# Patient Record
Sex: Male | Born: 1940 | Race: White | Hispanic: No | Marital: Married | Smoking: Never smoker
Health system: Southern US, Community
[De-identification: ages and names within clinical notes are randomized; demographics above are authoritative.]

## PROBLEM LIST (undated history)

## (undated) DIAGNOSIS — E039 Hypothyroidism, unspecified: Secondary | ICD-10-CM

## (undated) DIAGNOSIS — G4733 Obstructive sleep apnea (adult) (pediatric): Secondary | ICD-10-CM

## (undated) DIAGNOSIS — N529 Male erectile dysfunction, unspecified: Secondary | ICD-10-CM

## (undated) DIAGNOSIS — IMO0002 Reserved for concepts with insufficient information to code with codable children: Secondary | ICD-10-CM

## (undated) DIAGNOSIS — R5381 Other malaise: Secondary | ICD-10-CM

## (undated) DIAGNOSIS — Z9861 Coronary angioplasty status: Secondary | ICD-10-CM

## (undated) DIAGNOSIS — D696 Thrombocytopenia, unspecified: Secondary | ICD-10-CM

## (undated) DIAGNOSIS — M25539 Pain in unspecified wrist: Secondary | ICD-10-CM

## (undated) DIAGNOSIS — R41840 Attention and concentration deficit: Secondary | ICD-10-CM

## (undated) DIAGNOSIS — H8110 Benign paroxysmal vertigo, unspecified ear: Secondary | ICD-10-CM

## (undated) DIAGNOSIS — K117 Disturbances of salivary secretion: Secondary | ICD-10-CM

## (undated) DIAGNOSIS — M1710 Unilateral primary osteoarthritis, unspecified knee: Secondary | ICD-10-CM

## (undated) DIAGNOSIS — H269 Unspecified cataract: Secondary | ICD-10-CM

## (undated) DIAGNOSIS — M79609 Pain in unspecified limb: Secondary | ICD-10-CM

## (undated) DIAGNOSIS — I480 Paroxysmal atrial fibrillation: Secondary | ICD-10-CM

## (undated) DIAGNOSIS — F329 Major depressive disorder, single episode, unspecified: Secondary | ICD-10-CM

## (undated) DIAGNOSIS — F3289 Other specified depressive episodes: Secondary | ICD-10-CM

## (undated) DIAGNOSIS — G4700 Insomnia, unspecified: Secondary | ICD-10-CM

## (undated) DIAGNOSIS — R310 Gross hematuria: Secondary | ICD-10-CM

## (undated) DIAGNOSIS — I2510 Atherosclerotic heart disease of native coronary artery without angina pectoris: Secondary | ICD-10-CM

## (undated) DIAGNOSIS — E785 Hyperlipidemia, unspecified: Secondary | ICD-10-CM

## (undated) DIAGNOSIS — R5383 Other fatigue: Secondary | ICD-10-CM

## (undated) DIAGNOSIS — H9190 Unspecified hearing loss, unspecified ear: Secondary | ICD-10-CM

## (undated) DIAGNOSIS — G729 Myopathy, unspecified: Secondary | ICD-10-CM

## (undated) DIAGNOSIS — I10 Essential (primary) hypertension: Secondary | ICD-10-CM

## (undated) HISTORY — DX: Disturbances of salivary secretion: K11.7

## (undated) HISTORY — DX: Other malaise: R53.81

## (undated) HISTORY — DX: Male erectile dysfunction, unspecified: N52.9

## (undated) HISTORY — DX: Essential (primary) hypertension: I10

## (undated) HISTORY — DX: Unspecified cataract: H26.9

## (undated) HISTORY — DX: Hypothyroidism, unspecified: E03.9

## (undated) HISTORY — DX: Other malaise: R53.83

## (undated) HISTORY — DX: Attention and concentration deficit: R41.840

## (undated) HISTORY — DX: Atherosclerotic heart disease of native coronary artery without angina pectoris: I25.10

## (undated) HISTORY — DX: Coronary angioplasty status: Z98.61

## (undated) HISTORY — DX: Pain in unspecified wrist: M25.539

## (undated) HISTORY — PX: EXPLORATORY LAPAROTOMY: SUR591

## (undated) HISTORY — DX: Unilateral primary osteoarthritis, unspecified knee: M17.10

## (undated) HISTORY — DX: Insomnia, unspecified: G47.00

## (undated) HISTORY — DX: Reserved for concepts with insufficient information to code with codable children: IMO0002

## (undated) HISTORY — DX: Obstructive sleep apnea (adult) (pediatric): G47.33

## (undated) HISTORY — DX: Myopathy, unspecified: G72.9

## (undated) HISTORY — DX: Hyperlipidemia, unspecified: E78.5

## (undated) HISTORY — DX: Paroxysmal atrial fibrillation: I48.0

## (undated) HISTORY — DX: Unspecified hearing loss, unspecified ear: H91.90

## (undated) HISTORY — DX: Pain in unspecified limb: M79.609

## (undated) HISTORY — DX: Other specified depressive episodes: F32.89

## (undated) HISTORY — DX: Major depressive disorder, single episode, unspecified: F32.9

## (undated) HISTORY — PX: APPENDECTOMY: SHX54

## (undated) HISTORY — DX: Benign paroxysmal vertigo, unspecified ear: H81.10

## (undated) HISTORY — DX: Gross hematuria: R31.0

## (undated) HISTORY — DX: Thrombocytopenia, unspecified: D69.6

---
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
## 1966-11-29 HISTORY — PX: OTHER SURGICAL HISTORY: SHX169

## 1972-11-29 HISTORY — PX: STOMACH SURGERY: SHX791

## 1985-11-29 HISTORY — PX: CHOLECYSTECTOMY: SHX55

## 1987-11-30 HISTORY — PX: SEPTOPLASTY: SUR1290

## 1993-11-29 HISTORY — PX: NASAL SINUS SURGERY: SHX719

## 2003-05-07 ENCOUNTER — Ambulatory Visit (HOSPITAL_COMMUNITY): Admission: RE | Admit: 2003-05-07 | Discharge: 2003-05-07 | Payer: Self-pay | Admitting: *Deleted

## 2003-05-07 ENCOUNTER — Encounter (INDEPENDENT_AMBULATORY_CARE_PROVIDER_SITE_OTHER): Payer: Self-pay | Admitting: *Deleted

## 2003-05-16 ENCOUNTER — Encounter: Payer: Self-pay | Admitting: Otolaryngology

## 2003-05-16 ENCOUNTER — Encounter: Admission: RE | Admit: 2003-05-16 | Discharge: 2003-05-16 | Payer: Self-pay | Admitting: Otolaryngology

## 2003-08-01 ENCOUNTER — Encounter: Admission: RE | Admit: 2003-08-01 | Discharge: 2003-10-30 | Payer: Self-pay | Admitting: Internal Medicine

## 2003-11-30 HISTORY — PX: ANKLE SURGERY: SHX546

## 2004-09-04 ENCOUNTER — Ambulatory Visit (HOSPITAL_COMMUNITY): Admission: RE | Admit: 2004-09-04 | Discharge: 2004-09-05 | Payer: Self-pay | Admitting: Orthopedic Surgery

## 2005-05-13 ENCOUNTER — Inpatient Hospital Stay (HOSPITAL_COMMUNITY): Admission: EM | Admit: 2005-05-13 | Discharge: 2005-05-21 | Payer: Self-pay | Admitting: *Deleted

## 2005-06-10 ENCOUNTER — Emergency Department (HOSPITAL_COMMUNITY): Admission: EM | Admit: 2005-06-10 | Discharge: 2005-06-11 | Payer: Self-pay | Admitting: Emergency Medicine

## 2005-07-08 ENCOUNTER — Encounter (HOSPITAL_COMMUNITY): Admission: RE | Admit: 2005-07-08 | Discharge: 2005-10-06 | Payer: Self-pay | Admitting: Cardiology

## 2005-10-07 ENCOUNTER — Encounter (HOSPITAL_COMMUNITY): Admission: RE | Admit: 2005-10-07 | Discharge: 2006-01-05 | Payer: Self-pay | Admitting: Cardiology

## 2006-10-26 ENCOUNTER — Inpatient Hospital Stay (HOSPITAL_COMMUNITY): Admission: AD | Admit: 2006-10-26 | Discharge: 2006-10-31 | Payer: Self-pay | Admitting: Cardiology

## 2006-10-27 ENCOUNTER — Encounter (INDEPENDENT_AMBULATORY_CARE_PROVIDER_SITE_OTHER): Payer: Self-pay | Admitting: Cardiology

## 2006-12-05 ENCOUNTER — Ambulatory Visit (HOSPITAL_COMMUNITY): Admission: RE | Admit: 2006-12-05 | Discharge: 2006-12-05 | Payer: Self-pay | Admitting: *Deleted

## 2007-01-04 ENCOUNTER — Ambulatory Visit (HOSPITAL_COMMUNITY): Admission: RE | Admit: 2007-01-04 | Discharge: 2007-01-04 | Payer: Self-pay | Admitting: Cardiology

## 2007-06-23 ENCOUNTER — Encounter: Admission: RE | Admit: 2007-06-23 | Discharge: 2007-06-23 | Payer: Self-pay | Admitting: Internal Medicine

## 2007-06-27 ENCOUNTER — Encounter: Admission: RE | Admit: 2007-06-27 | Discharge: 2007-06-27 | Payer: Self-pay | Admitting: Internal Medicine

## 2007-11-24 ENCOUNTER — Emergency Department (HOSPITAL_COMMUNITY): Admission: EM | Admit: 2007-11-24 | Discharge: 2007-11-24 | Payer: Self-pay | Admitting: Family Medicine

## 2008-04-05 ENCOUNTER — Ambulatory Visit (HOSPITAL_COMMUNITY): Admission: RE | Admit: 2008-04-05 | Discharge: 2008-04-05 | Payer: Self-pay | Admitting: *Deleted

## 2008-04-05 LAB — HM COLONOSCOPY

## 2009-11-29 HISTORY — PX: EYE SURGERY: SHX253

## 2010-02-27 DIAGNOSIS — H269 Unspecified cataract: Secondary | ICD-10-CM

## 2010-02-27 HISTORY — DX: Unspecified cataract: H26.9

## 2010-04-24 ENCOUNTER — Inpatient Hospital Stay (HOSPITAL_COMMUNITY): Admission: RE | Admit: 2010-04-24 | Discharge: 2010-04-26 | Payer: Self-pay | Admitting: Orthopedic Surgery

## 2010-04-24 HISTORY — PX: WRIST SURGERY: SHX841

## 2010-11-17 IMAGING — CR DG CHEST 2V
2 series · 2 of 2 positions shown · non-contrast
Comparison: 01/04/2007

CLINICAL DATA: Preop.

CHEST - 2 VIEW

[w chest pa]
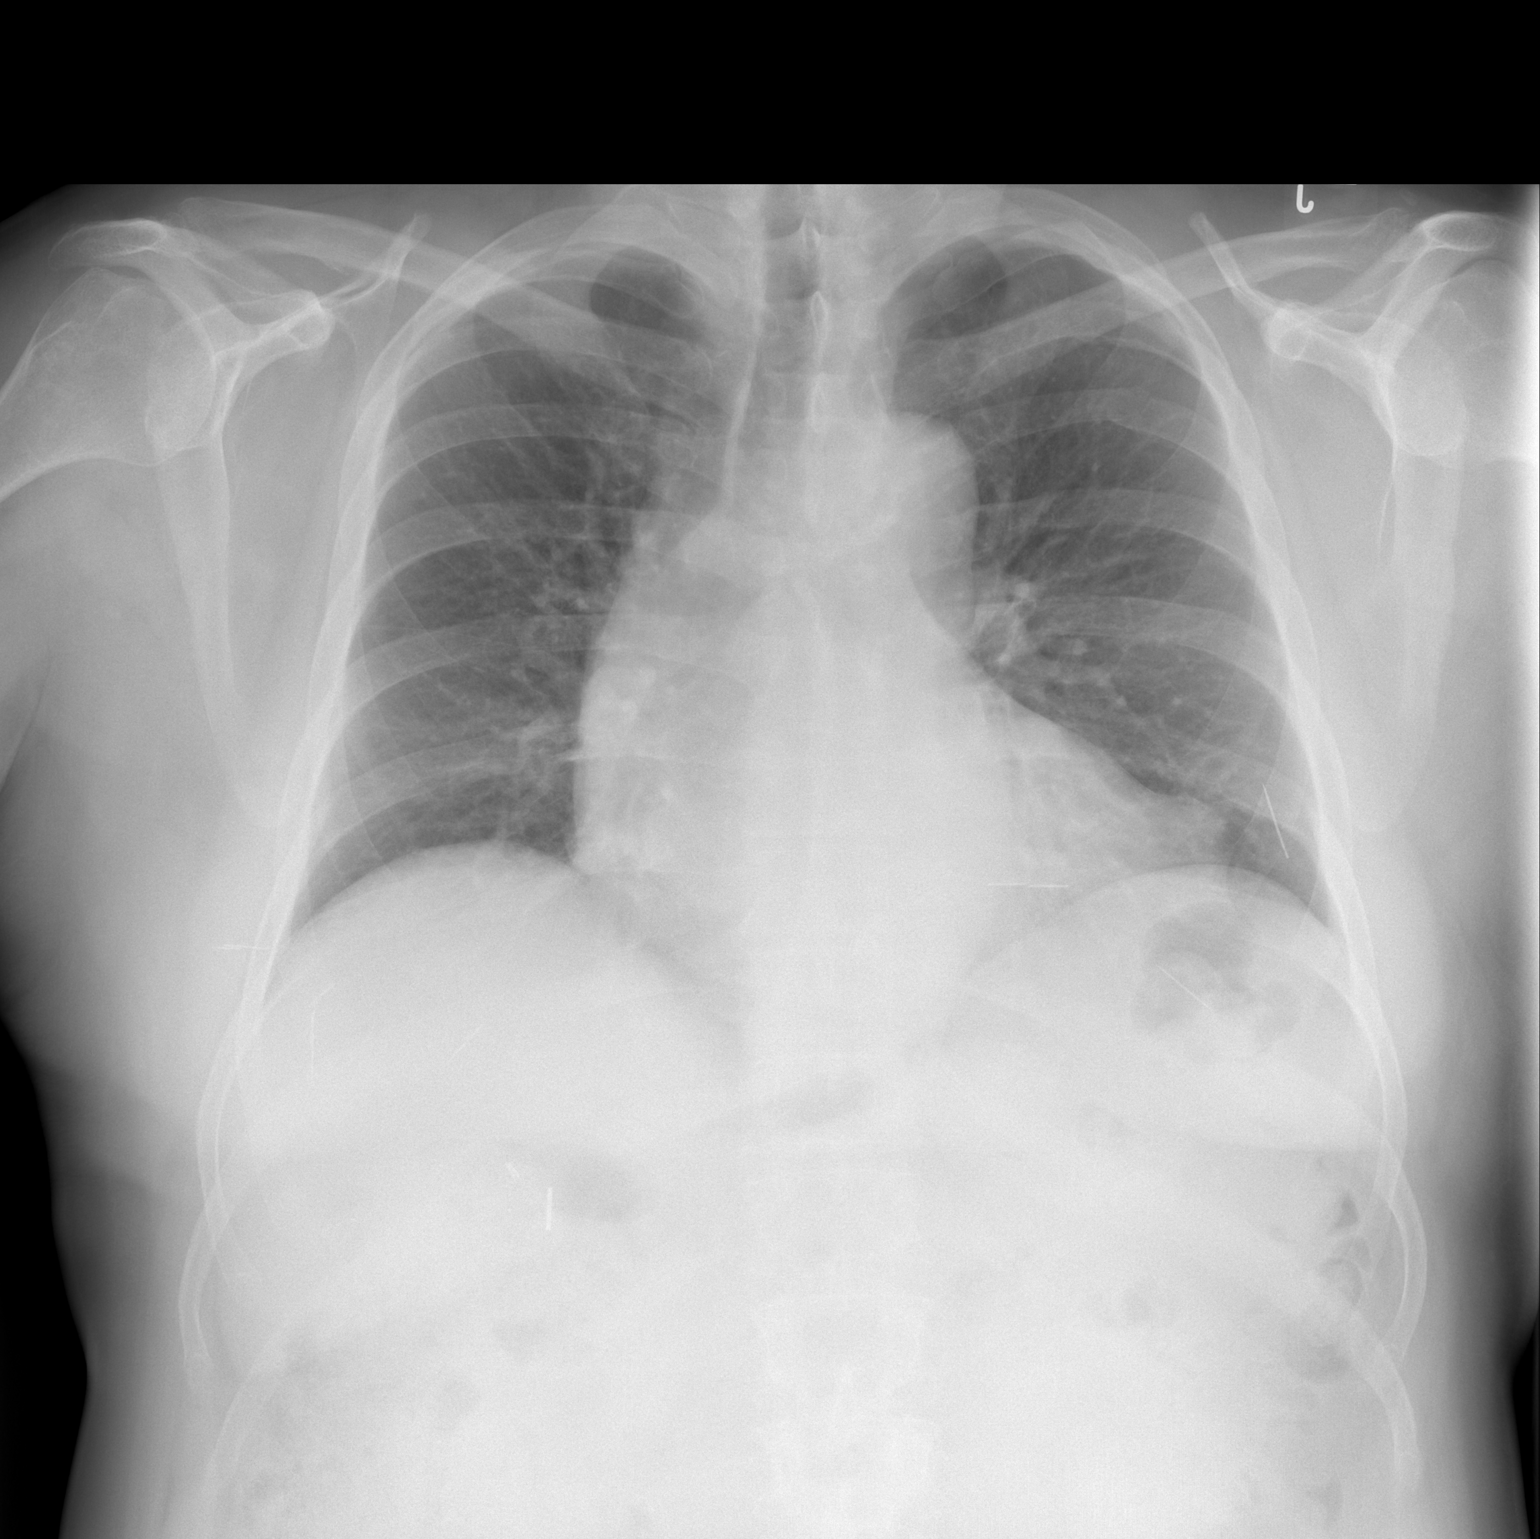

[w chest lat]
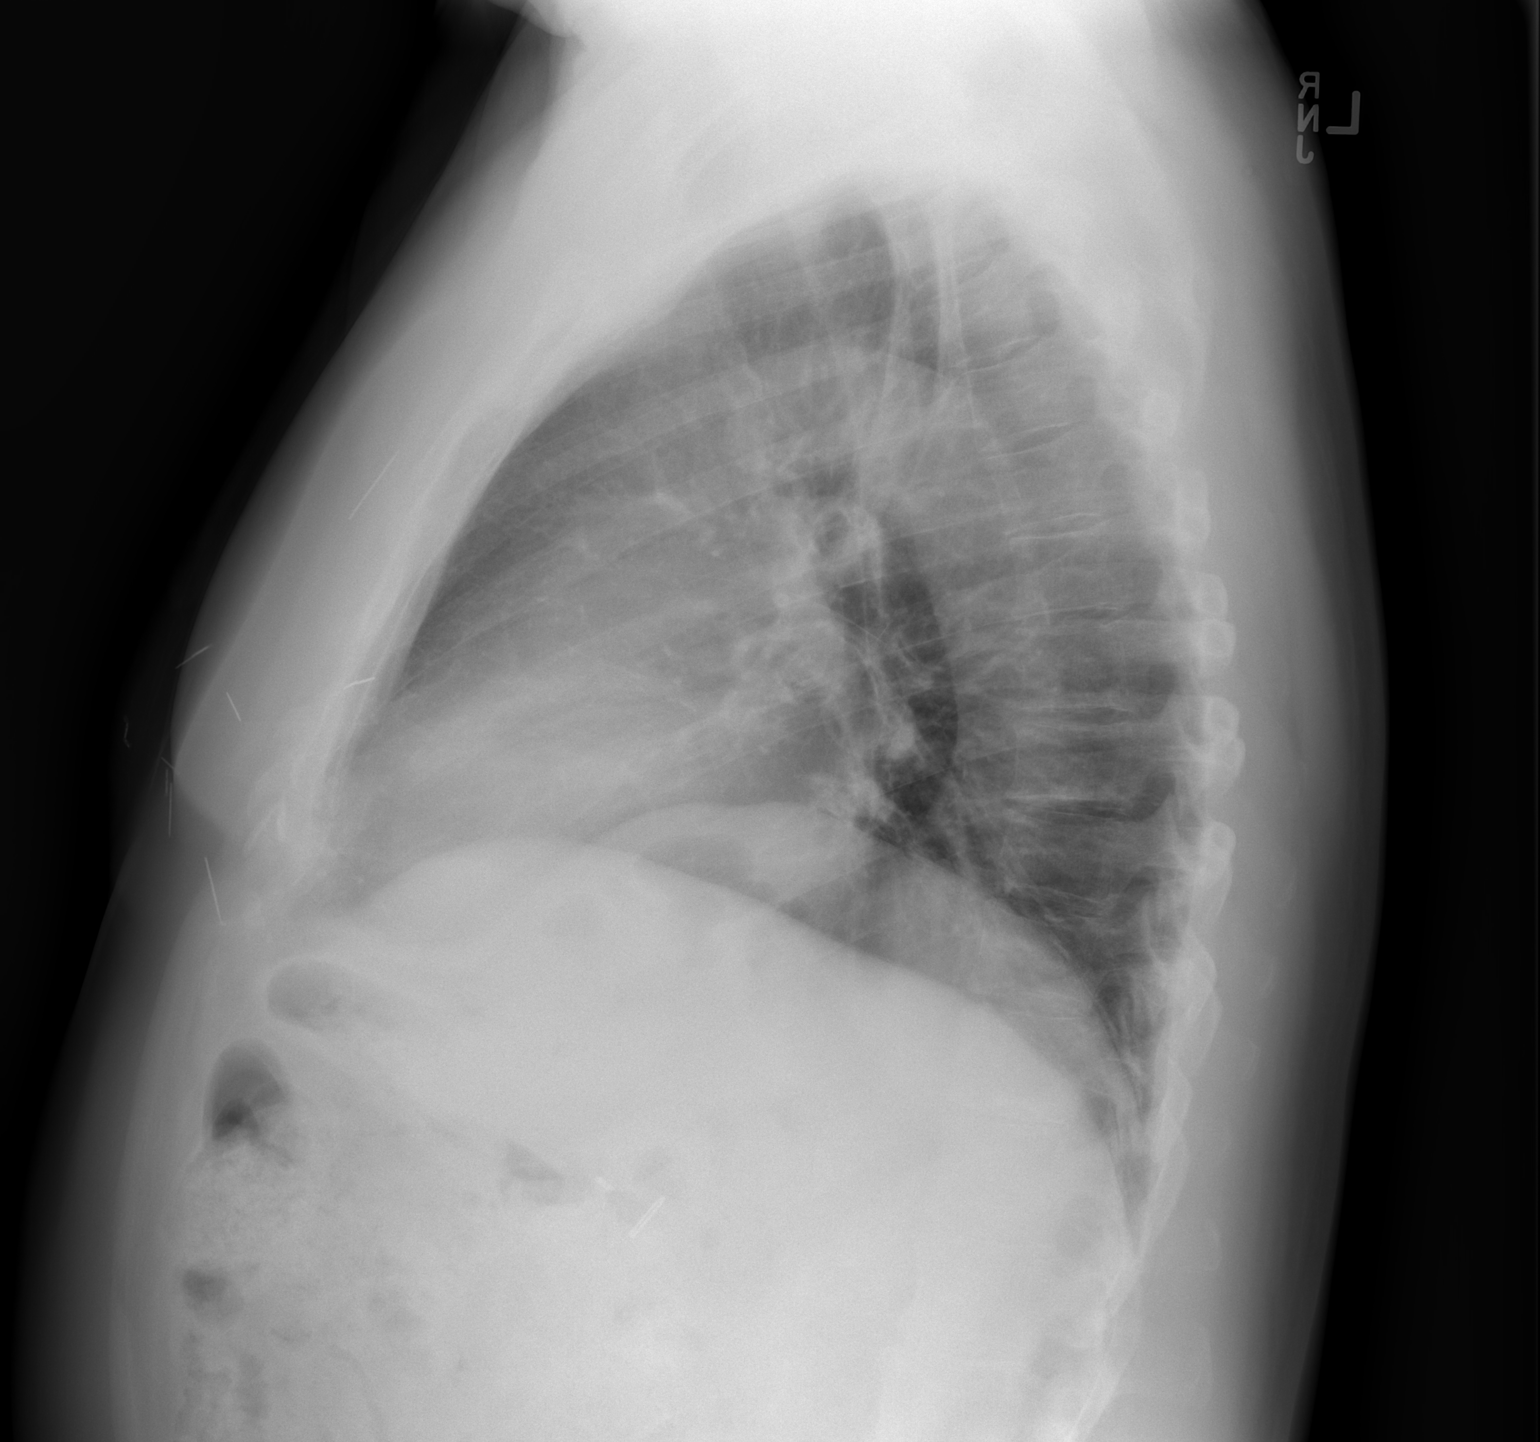

[2 of 2 positions shown; findings below may reference images not displayed]

FINDINGS: Trachea is midline.  Heart size stable.  Lungs are
somewhat low in volume but clear.  No pleural fluid.  Linear
densities in the anterior soft tissues of the lower chest have the
appearance of acupuncture needles.
IMPRESSION: No acute findings.

## 2011-02-15 LAB — COMPREHENSIVE METABOLIC PANEL
Alkaline Phosphatase: 81 U/L (ref 39–117)
BUN: 19 mg/dL (ref 6–23)
Calcium: 9.8 mg/dL (ref 8.4–10.5)
Chloride: 101 mEq/L (ref 96–112)
Creatinine, Ser: 0.99 mg/dL (ref 0.4–1.5)
GFR calc non Af Amer: >60 mL/min (ref >60)
Potassium: 4.1 mEq/L (ref 3.5–5.1)
Total Bilirubin: 1 mg/dL (ref 0.3–1.2)
Total Protein: 7.2 g/dL (ref 6.0–8.3)

## 2011-02-15 LAB — CBC
HCT: 45.9 % (ref 39.0–52.0)
Hemoglobin: 15.6 g/dL (ref 13.0–17.0)
MCV: 101.3 fL — ABNORMAL HIGH (ref 78.0–100.0)

## 2011-02-15 LAB — DIFFERENTIAL
Basophils Absolute: 0 10*3/uL (ref 0.0–0.1)
Eosinophils Absolute: 0.1 10*3/uL (ref 0.0–0.7)
Eosinophils Relative: 2 % (ref 0–5)
Lymphs Abs: 2.1 10*3/uL (ref 0.7–4.0)
Monocytes Absolute: 0.5 10*3/uL (ref 0.1–1.0)
Neutro Abs: 2.6 10*3/uL (ref 1.7–7.7)

## 2011-04-13 NOTE — Op Note (Signed)
NAME:  Derek Sweeney, BURKHALTER NO.:  [id number]   MEDICAL RECORD NO.:  [id number]          PATIENT TYPE:  AMB   LOCATION:  ENDO                         FACILITY:  [facility]   PHYSICIAN:  Georgiana Spinner, M.D.    DATE OF BIRTH:  1941-06-02   DATE OF PROCEDURE:  04/05/2008  DATE OF DISCHARGE:                               OPERATIVE REPORT   PROCEDURE:  Colonoscopy.   INDICATIONS:  Colon cancer screening, family history of colon cancer.   ANESTHESIA:  Fentanyl 50 mcg, Versed 5 mg.   PROCEDURE:  With the patient mildly sedated in the left lateral  decubitus position a rectal examination was performed, which to my exam  was unremarkable.  Prostate felt normal.  Subsequently the Pentax  videoscopic colonoscope was inserted into the rectum and passed under  direct vision to the cecum, identified by the ileocecal valve and  appendiceal orifice, both of which were photographed.  From this point  the colonoscope was slowly withdrawn taking circumferential views of the  colonic mucosa, stopping only in the rectum, which appeared normal on  direct and showed hemorrhoids on retroflexed view.  The endoscope was  straightened and withdrawn.  The patient's vital signs and pulse  oximetry remained stable.  The patient tolerated the procedure well  without apparent complications.   FINDINGS:  Internal hemorrhoids, otherwise an unremarkable examination,  limited slightly by the fact that the prep was slightly suboptimal and  that there were areas of greenish liquid material that was opaque but we  suctioned as best we could.  No gross lesions noted.   PLAN:  Repeat examination in 5 years.           ______________________________  Georgiana Spinner, M.D.     GMO/MEDQ  D:  04/05/2008  T:  04/05/2008  Job:  [id number]   cc:   Dr. [facility]

## 2011-04-16 NOTE — Cardiovascular Report (Signed)
NAME:  LINKON, SIVERSON NO.:  [id number]   MEDICAL RECORD NO.:  [id number]          PATIENT TYPE:  INP   LOCATION:  6527                         FACILITY:  [facility]   PHYSICIAN:  Madaline Savage, M.D.DATE OF BIRTH:  1941-01-08   DATE OF PROCEDURE:  05/14/2005  DATE OF DISCHARGE:                              CARDIAC CATHETERIZATION   PROCEDURES PERFORMED:  1.  Selective coronary angiography by Judkins technique.  2.  Retrograde left heart catheterization.  3.  Left ventricular angiography.  4.  Right heart catheterization.  5.  Thermodilution cardiac output determinations.  6.  Percutaneous coronary stenting of the proximal left anterior descending      coronary artery.  7.  AngioSeal closure of the right femoral artery.   COMPLICATIONS:  None.   ENTRY SITE:  Right femoral artery and vein.   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 Baltazar Najjar  and was admitted yes on May 13, 2005 for exertional shortness of breath,  exertional chest pain and very rapid atrial fibrillation which was difficult  to control in terms of ventricular response.   The patient's cardiac enzymes were trace positive for myocardial infarction  CK-MBs were negative. He enters the cath lab today for further evaluation of  his symptomatology in terms of being a driver for atrial fibrillation which  is definitely a recent new rhythm.   RESULTS:  Right atrial mean pressure 16, right ventricular pressure 43/8,  mean of 17. Pulmonary capillary wedge mean 22. Central aorta 95/70 mean of  83, left ventricular pressure 102/15, end-diastolic pressure 21. Cardiac  output determinations were Fick 4.9. Cardiac output Fick cardiac index 2.43.  Thermodilution cardiac output the same.   ANGIOGRAPHIC RESULTS:  The left main coronary artery was normal.   The left anterior descending coronary artery coursed  to the cardiac apex and  gave rise to a diagonal branch very proximally located in the vessel. Just  before the first septal perforator branch was an area of 75% short segmental  stenosis with a lesion length of approximately 10-12 mm. The mid and distal  LAD were normal.   There was a twin or a pair of ramus branches off of the left main coronary  artery that was medium in size and normal.   The circumflex coronary artery was a codominant vessel with the RCA and it  too was normal.   The right coronary artery was codominant with the circumflex and gave rise  to a posterolateral branch and small PDA as well. No lesions were seen in  this will RCA.   Left ventricle showed normal contractility of all wall segments with no  evidence of ischemia or infarction. Ejection fraction was 60%. There was  also noted to be 3+ mitral regurgitation with filling of the left atrium by  about the second and third systolic beat.   Percutaneous coronary intervention was accomplished on the LAD using a left  Judkins 6-French FL-4 guide. The guide wire used was a Geologist, engineering. No  predilatation balloon was required. A stent was placed which was a 3.0 x 18  mm Cypher stent. I postdilated the Cypher stent with a 3.5 x 15 Quantum  Maverick and dilated twice making sure that the distal end of the stent was  covered on the first inflation and the proximal end of the stent was covered  on the second inflation and inflated to 18 atmospheres of pressure which  would predict a 3.62 in a lumen diameter. After completion of the post  dilatation of the Cypher stent, there was 0% residual stenosis, intact TIMI  III flow and no evidence of any side branch or adjacent vessel injury.   The patient tolerated procedure well. I then went to the right groin and  took a picture of the right femoral artery and then did a percutaneous  closure with Angio-Seal and that went well.   FINAL DIAGNOSES:  1.  New onset of  shortness of breath with exertion, chest pain with      exertion, and presumably new onset of atrial fibrillation.  2.  Successful percutaneous coronary intervention on single-vessel disease      with left anterior descending artery stenosis 75% reduced to 0%      residual.  3.  Good left ventricular systolic function.  4.  Moderate to severe mitral regurgitation.  5.  Atrial fibrillation presumably new.   PLAN:  The patient's mitral valve disease may have a lot to do with his  atrial fibrillation. We therefore need to try to get a two-dimensional  echocardiogram to see more anatomic information as it relates to the  etiology of MR and also the possibility of transesophageal echocardiography  and then cardioversion. This will be discussed with colleagues and decided  upon.       WHG/MEDQ  D:  05/14/2005  T:  05/15/2005  Job:  [id number]   cc:   Maxwell Caul, M.D.  [address]  [place]  [place] [address]  Fax: [phone]   [facility]   [facility]

## 2011-04-16 NOTE — Op Note (Signed)
NAME:  Derek Sweeney, Derek Sweeney NO.:  [id number]   MEDICAL RECORD NO.:  [id number]          PATIENT TYPE:  OIB   LOCATION:  NA                           FACILITY:  [facility]   PHYSICIAN:  Leonides Grills, M.D.     DATE OF BIRTH:  15-Nov-1941   DATE OF PROCEDURE:  09/04/2004  DATE OF DISCHARGE:                                 OPERATIVE REPORT   PREOPERATIVE DIAGNOSES:  1.  Left ankle impingement.  2.  Left tibial spurs.  3.  Left talar spurs.  4.  Medial left talar dome osteochondral lesion.  5.  Left hallux rigidus.   POSTOPERATIVE DIAGNOSES:  1.  Left ankle impingement.  2.  Left tibial spurs.  3.  Left talar spurs.  4.  Medial left talar dome osteochondral lesion.  5.  Left hallux rigidus.   OPERATIONS:  1.  Left ankle arthroscopy with extensive debridement.  2.  Left excision, tibial spurs.  3.  Left excision, talar spurs.  4.  Debridement and drilling, left medial talar dome osteochondral lesion.  5.  Left great toe cheilectomy.  6.  Stress x-rays, left ankle.   ANESTHESIA:  General endotracheal tube.   SURGEON:  Leonides Grills, M.D.   ASSISTANT:  Lianne Cure, P.A.   ESTIMATED BLOOD LOSS:  Minimal.   TOURNIQUET TIME:  Approximately an hour.   COMPLICATIONS:  None.   DISPOSITION:  Stable to the PAR.   INDICATIONS:  This is a 70 year old gentleman who has had longstanding left  ankle pain that was interfering with his life to the point that he could not  do what he wants to do.  He was consented for the above procedure.  All  risks, which include infection, nerve or vessel injury, persistent pain,  worsening pain, stiffness, arthritis, and possible future surgery, were all  explained.  Questions were encouraged and answered.   OPERATION:  The patient was brought to the operating room and placed in the  supine position after adequate general endotracheal tube anesthesia was  administered as well as Ancef 1 g IV piggyback.  The left lower extremity  was  then prepped and draped in a sterile manner over a proximally-placed  thigh tourniquet with a bump placed under the left ipsilateral hip.  Once  the leg was prepped and draped in a sterile manner, we started the procedure  with the placement of a spinal needle into the ankle anteromedially after  the anatomical landmarks were marked out.  We then created an anteromedial  portal with the nick and spread technique.  A blunt-tipped trocar with  cannula followed by a camera was then placed in the ankle under direct  visualization.  The anterolateral portal was then created with a spinal  needle in nick and spread technique.  There was a tremendous amount of  synovitis over the entire anterior aspect as well as the medial and lateral  gutters.  There was an enormous spur on the anterior aspect of the tibia  with an impingement spur on the talus as well.  Once the ankle was  extensively debrided, we then removed  the spur off the tibia, talus,  respectively, with a bur, curved quarter-inch osteotome, and a mini-open  anteromedially to remove the enormous osteophyte anteromedially.  Once this  was done and the entire anterior aspect of the ankle was cleared out, we  uncovered the medial talar dome osteochondral lesion.  This was then  debrided with a House curette and a shaver, and then we used a microfracture  technique on this area.  This had excellent bleeding surfaces once the  inflow was decreased.  Once this was done, we then focused our attention to  the left great toe.  A longitudinal incision was made medial to the EHL  tendon, dissection was carried down through skin, hemostasis was obtained.  The capsule was opened in line with the incision.  There was enormous  osteophyte dorsally.  This was then dissected out with a sagittal saw.  This  was then removed.  Edges were then rounded off with a rongeur as well as the  medial and lateral gutters.  The dorsal base of the proximal phalanx was   also rongeured off as well, which had a large spur.  The area was copiously  irrigated with normal saline and bone wax was placed over the exposed bone  surfaces.  The tourniquet was deflated.  Hemostasis was obtained.  The  capsule was closed with 3-0 Vicryl suture over all wounds.  Wounds were  closed with 4-0 nylon suture, sterile dressing was applied.  There was a  palpable dorsalis pedis pulse.  A sterile dressing was applied. A Cam walker  boot was applied.  The patient was stable to the PAR.       PB/MEDQ  D:  09/04/2004  T:  09/05/2004  Job:  [id number]

## 2011-04-16 NOTE — Discharge Summary (Signed)
NAME:  Derek Sweeney, Derek Sweeney NO.:  [id number]   MEDICAL RECORD NO.:  [id number]          PATIENT TYPE:  INP   LOCATION:  4732                         FACILITY:  [facility]   PHYSICIAN:  Madaline Savage, M.D.DATE OF BIRTH:  Nov 26, 1941   DATE OF ADMISSION:  10/26/2006  DATE OF DISCHARGE:  10/31/2006                               DISCHARGE SUMMARY   DISCHARGE DIAGNOSES:  1. New onset atrial fibrillation with rapid ventricular response,      i.e., tachycardia.  2. Rate-controlled at the time of discharge.  3. Coronary artery disease with a history of stent to the left      anterior descending artery.  4. Sinus infection.  5. Hyperlipidemia, treated.  6. History of obstructive sleep apnea, on CPAP.   CONDITION ON DISCHARGE:  Improved.   DISCHARGE MEDICATIONS:  1. Coumadin 5 mg daily except for 2.5 on Wednesdays.  2. Aspirin 81 mg daily.  3. Synthroid 50 mcg daily.  4. Protonix 40 mg daily.  5. Claritin 10 mg daily.  6. Flonase twice a day.  7. Avelox 400 mg one daily for five more days.  8. Metoprolol 50 mg half-a-tab twice a day.  9. Amiodarone 200 mg twice a day.  10.Lanoxin 0.125 mg daily.  11.Crestor 10 mg daily.  12.CPAP as before.  13.Continue Lunesta, Restasis, Gas-X, fish oil and Tylenol with      codeine.  14.Stop glucosamine, Cialis, Mobic, niacin, and stop Plavix, stop      Pravachol, stop Altace.   DISCHARGE INSTRUCTIONS:  1. Low-fat, low-salt diet.  2. Increase activity slowly.  3. Follow-up with Dr. Elsie Lincoln.  The office will call you for a date and      time.  4. Follow-up with Dr. Jearld Fenton, ENT.  The office will call you for that      appointment as well.  5. Have lab work done Wednesday morning.   HISTORY OF PRESENT ILLNESS:  The patient was admitted October 26, 2006  by Dr. Clarene Duke, on-call for Dr. Elsie Lincoln, after presenting to the allergy  physician for sinus problems.  He started Synthroid about two weeks ago  for hypothyroidism.  They noted an  increased heart rate and the patient  was sent to Dr. Fredirick Maudlin office for evaluation.  There he was found to  be in rapid AFib and was admitted to [facility] from the office for  further control of AFib.   The patient had some paroxysmal AFib in the past converted to sinus  rhythm and discharged home previously, one year prior to this admission.  He had not been on Coumadin because he was in AFib for such a short  period of time.  He has a history of coronary artery disease with a  stent to the LAD approximately one year ago as well.  Just prior to this  admission, he had a taken a couple doses of decongestants secondary to  green discharge from a sinus infection and went to see his allergist and  at that time, he was found to be tachycardic.  Previously, he had been  on amiodarone, Toprol and  Cardizem.  All three of those drugs had been  stopped and recently he had been diagnosed with hypothyroidism and  started on Synthroid two weeks prior to the admission.   FAMILY HISTORY/SOCIAL HISTORY/REVIEW OF SYSTEMS:  Please see H&P.   PAST MEDICAL HISTORY:  As stated, coronary disease, hypothyroidism,  hypertension, hyperlipidemia, seasonal allergies and history of PAF.   ALLERGIES:  PENICILLIN CAUSED ANAPHYLACTIC REACTION IN THE PAST.   MEDICATIONS:  Outpatient medications:  See H&P.   PHYSICAL EXAMINATION:  VITAL SIGNS:  At discharge, blood pressure  112/73.  Pulse 72 to 110.  Temp 98.6.  LUNGS:  Clear.  HEART:  Irregularly irregular.   Only complaint was sinus congestion continuing.   LABORATORY DATA:  Hemoglobin 13.1, hematocrit 37.1, WBC 6.0, platelet  171,000 and these remain stable.  Heme for occult blood was negative.  Pro-time 14.4, INR of 1.1, PTT 33 on admission.  He was on heparin drip  and at discharge, pro-time 24.9,  INR of 2.1 and heparin 0.93.   Sodium 138, potassium 4.3, chloride 104, CO2 of 28, glucose 106, BUN 29,  creatinine 1.1, calcium 8.9, total protein 6.5,  albumin 3.7, AST 21, ALT  40, ALP 58, total bili 0.7, magnesium 2.4 and these remain stable.   Cardiac enzymes:  CK of 73, MB of 1.5, troponin-I of 0.01.   Cholesterol 138, triglycerides 70, HDL 36, and LDL 88.  TSH 2.305, dig  1.1.  Sinus culture:  Few dipthteroids.   Chest x-ray:  Mild cardiomegaly, bibasilar atelectasis.  EKG on  admission:  AFib with rapid ventricular response.  Heart rate was 125.   A 2D echo done October 27, 2006 for echo window:  EF was estimated to  be 65%, moderate focal basal septal hypertrophy, aortic valve thickness  was mildly increased, aortic valve was mildly calcified, there was mild  fibrocalcific change of aortic root, mitral valve regurg, the left  atrium was mildly to moderately dilated, the right ventricle was mildly  dilated, the right atrium was mildly to moderately dilated.   HOSPITAL COURSE:  The patient was admitted with rapid ventricular  response of AFib, placed on IV heparin and amiodarone secondary to  borderline blood pressure.  He was started on Coumadin.  He stabilized.  Heart rate continued to be elevated.  Meds were adjusted until heart  rate was fairly well-controlled at time of discharge.  He went from  heparin to Coumadin crossover until INR was therapeutic at 2.1.  He  would follow-up as an outpatient.      Darcella Gasman. Annie Paras, N.P.    ______________________________  Madaline Savage, M.D.    LRI/MEDQ  D:  12/29/2006  T:  12/30/2006  Job:  [id number]   cc:   Dr. Hyacinth Meeker [facility]

## 2011-04-16 NOTE — Op Note (Signed)
   NAME:  Derek Sweeney, Derek Sweeney NO.:  [id number]   MEDICAL RECORD NO.:  [id number]                   PATIENT TYPE:  AMB   LOCATION:  ENDO                                 FACILITY:  [facility]   PHYSICIAN:  Georgiana Spinner, M.D.                 DATE OF BIRTH:  07/20/1941   DATE OF PROCEDURE:  05/07/2003  DATE OF DISCHARGE:                                 OPERATIVE REPORT   PROCEDURE:  Colonoscopy.   ANESTHESIA:  Demerol 50 mg, Versed 5 mg.   INDICATIONS:  Colon polyp, rectal bleeding.   PROCEDURE:  With the patient mildly sedated in the left lateral decubitus  position, the Olympus videoscopic colonoscope was inserted in the rectum and  passed under direct vision to the cecum, identified by ileocecal valve and  appendiceal orifice, both of which were photographed.  From this point the  colonoscope was slowly withdrawn, taking circumferential views of the entire  colonic mucosa, stopping only in the ascending colon, where a small polyp  was seen, photographed, and removed using hot biopsy forceps technique,  setting of 20-20 blended current.  There was a small amount of bleeding at  this point, and we further cauterized this tissue.  In the rectum the  endoscope was placed in retroflexion to view the anal canal from above.  The  endoscope was then straightened and withdrawn.  The patient's vital signs  and pulse oximetry remained stable.  The patient tolerated the procedure  well without apparent complications.   FINDINGS:  1. Small polyp of the ascending colon.  2. Internal hemorrhoids.  3. Otherwise unremarkable colonoscopic examination.   PLAN:  Await biopsy report.  The patient will call me for results and follow  up with me as an outpatient.  Proceed to endoscopy.                                               Georgiana Spinner, M.D.    GMO/MEDQ  D:  05/07/2003  T:  05/07/2003  Job:  [id number]

## 2011-04-16 NOTE — H&P (Signed)
NAME:  Derek Sweeney, Derek Sweeney NO.:  [id number]   MEDICAL RECORD NO.:  [id number]          PATIENT TYPE:  EMS   LOCATION:  MAJO                         FACILITY:  [facility]   PHYSICIAN:  Altha Harm, MDDATE OF BIRTH:  [birth year]   DATE OF ADMISSION:  05/13/2005  DATE OF DISCHARGE:                                HISTORY & PHYSICAL   CHIEF COMPLAINT:  Difficulty with breathing and chest pain.   HISTORY OF PRESENT ILLNESS:  This is a 70 year old gentleman with no prior  history of heart disease who presents with complaints of intermittent  dyspnea on exertion and shortness of breath for a few weeks.  The patient  states that he was also having orthopnea and noted that today he was having  some PND.  The patient also states that he had associated with PND this  morning diaphoresis and some chest pain which extended from the left over to  the right chest and was dull in nature, appearing to be like pressure.  Incidentally, the patient also has had a long driving trip which ended  approximately 2 days ago where he was immobile for long periods of time,  stating that he stopped several times on the trip.  The patient denies any  calf pain or swelling.  The patient denies any real palpitations.  The  patient does have obstructive sleep apnea and uses CPAP at night.  The  patient has had no chest pain in the past, no history of coronary artery  disease or angina.   PAST MEDICAL HISTORY:  Significant for obstructive sleep apnea and dependent  on CPAP at night.  Osteoarthritis, hypercholesterolemia.   PAST SURGICAL HISTORY:  Significant for multiple musculoskeletal repairs,  cholecystectomy, appendectomy, and exploratory laparotomy.   FAMILY HISTORY:  Significant for arrhythmias in first degree relatives.  There is no history of coronary artery disease.  There is a history of  hypertension in his family.   SOCIAL HISTORY:  The patient is a [employer] who resides with his wife  at home.  The patient denies any tobacco, alcohol, or drug use.   ALLERGIES:  PENICILLIN which has anaphylactic shock.   CURRENT MEDICATIONS:  1.  Glucosamine 500 mg p.o. b.i.d.  2.  Aspirin 81 mg p.o. daily.  3.  Pravachol 80 mg daily.  4.  Lodine 400 mg p.o. b.i.d.  5.  Restasis 0.05% drops one drop to each eye daily.  6.  Astelin two sprays to each nostril q.12h.  7.  Fexofenadine 180 mg daily.  8.  Flonase 50 mcg spray to each nostril twice a day.  9.  Tylenol No.3 q.4h p.r.n. pain.   REVIEW OF SYSTEMS:  The patient denies any weight loss, but states he has  had a recent weight gain of 15 pounds in approximately the last 4 months.  He denies any heat or cold intolerance.  He denies any lightheadedness, any  dizziness, any seizure activity or headaches.  Denies any cough or  hemoptysis.  Please see the history of present illness for other chest and  heart and lung symptoms.  The patient denies  any abdominal pain, he denies  any constipation, diarrhea, or symptoms of gastritis or reflux.  The patient  denies any urinary frequency, urinary incontinence, or urinary urgency.  He  denies any swelling in his legs, redness in his legs, or leg pain.  The  patient denies any claudication.  He denies any significant joint pain.  All  other systems are negative and noted in the HPI.   In the emergency room, the patient had a venous blood gas which showed a pH  of 7.37 and pCO2 of 44.6.  He had a CBC which showed a white blood cell  count of 12.4, hemoglobin 14.9, hematocrit 46.7, platelets 207.  The patient  had sodium of 135, potassium 6.8, chloride 108, bicarb 92, BUN 27, and  creatinine 1.2.  The patient had a PT/INR drawn which showed a prothrombin  time of 11.6 seconds, INR of 0.8.   PHYSICAL EXAMINATION:  GENERAL:  The patient is lying in Trendelenburg  position in bed.  He appears to be very comfortable in no acute distress.  The patient's wife is at bedside.  VITAL SIGNS:   Initial blood pressures in the emergency room is 109/80, the  patient's blood pressure is now down to 86/55.  Temperature is 99.3, heart  rate 150 to 168, and respirations vary between 18 to 26.  The patient is 93%  on room air.  HEENT:  The patient is normocephalic and atraumatic.  Pupils equal, round,  and reactive to light and accommodation.  Extraocular movements are intact.  There is no conjunctival pallor.  There is no icterus.  Tympanic membranes  are translucent bilaterally.  Fundi are benign.  Nasal mucosa is pale with  no polyps noted.  Oral mucosa is moist.  There is no exudate, erythema, or  lesions noted in the oropharynx.  NECK:  Supple.  Trachea is midline.  No masses and no thyromegaly.  There is  no JVD or carotid bruit.  LUNGS:  The patient has normal respiratory effort without accessory muscle  use, equal excursion bilaterally.  Lung fields are clear to auscultation.  There is no wheezing or rhonchi noted.  There is no increased work of  breathing.  He is resonant to percussion.  CARDIOVASCULAR:  The patient has an irregularly irregular heart beat.  He is  tachycardic.  I am unable to appreciate any murmurs, rubs, or gallops.  PMI  is not displaced.  There are no heaves or thrills on palpation.  ABDOMEN:  The patient has normal active bowel sounds.  His abdomen is mildly  obese, soft, nontender, and nondistended.  No masses and no  hepatosplenomegaly.  LYMPHOCYTIC:  There is no cervical, axillary, or inguinal lymphadenopathy  noted.  Skin is warm and dry.  There is no redness or tenderness on  palpation.  MUSCULOSKELETAL:  The patient has normal range of motion for bilateral upper  and lower extremities.  There is no evidence of swelling, warmth, or  erythema around the joints.  There is no calf swelling bilaterally.  There  is no erythema or warmth noted in the calves bilaterally. NEUROLOGY:  The patient has no focal neurological deficits.  Cranial nerves  II-XII  grossly intact.  DTR's are 2+ bilaterally upper and lower  extremities.  Sensation is intact to light touch, pinprick, and  proprioception.  Strength is at least 3+ out of 5.  No form of strength  testing was done at this time secondary to the patient's risk of further  hypotension with Valsalva movements.  PSYCHOLOGICAL:  The patient is alert and oriented x3.  He appears to have  normal affect, good recent and remote recall.   ASSESSMENT:  This is a patient with new onset atrial fibrillation.  I would  like to rule out an MI on this patient as the cause for his atrial  fibrillation also in light of the patient's recent long driving trip,  though, the patient does not appear to have any significant hypoxia on  saturations.  I would like to get a CT angiogram to be sure the patient did  not have a pulmonary embolism.  The patient has already been started on  heparin.  He will continue on a weight-based heparin with pharmacy to  follow.  He will start Coumadin when his PTT are in therapeutic ranges.  The  patient will also be started on a Cardizem drip for a rate control with  parameters for his blood pressure.  The patient does have some hypotension  at this time and the patient will be supported with fluids with caution on  the Cardizem.  We may need to change the patient to a less hypotensive  medication if he is not able to recover his blood pressures.  The patient  will also continue on his aspirin on a daily basis.  All other medical  problems appear to be stable at this  time.  The patient will be continued on his current medications.  Code  status is full code on this patient.  At this time, I do not feel that we  need to involve cardiology with the patient, however, if the patient's  course of recovery is not as expected, we will have cardiology to see the  patient at that time.       MAM/MEDQ  D:  05/13/2005  T:  05/14/2005  Job:  [id number]

## 2011-04-16 NOTE — Discharge Summary (Signed)
NAME:  Derek Sweeney, Derek Sweeney NO.:  [id number]   MEDICAL RECORD NO.:  [id number]          PATIENT TYPE:  INP   LOCATION:  3737                         FACILITY:  [facility]   PHYSICIAN:  Lonia Blood, M.D.DATE OF BIRTH:  1941/02/13   DATE OF ADMISSION:  05/13/2005  DATE OF DISCHARGE:  05/21/2005                                 DISCHARGE SUMMARY   CHIEF COMPLAINT:  Difficulty breathing and chest pain.   DISCHARGE DIAGNOSES:  1.  Coronary artery disease with ischemia.      1.  Mild elevation serum troponin 0.19 to 0.28.      2.  Cardiology consult [facility] with Dr. Royston Cowper al.      3.  Cardiac catheterization May 14, 2005, with percutaneous stenting          proximal left anterior descending.  This with 75% stenosis reduced          to 0% residual.  Noted good left ventricular systolic function and          moderate to severe mitral valve regurgitation.  Noted atrial          fibrillation probably new.      4.  Currently on aspirin and Plavix only per cardiology request.  Post          discharge plan will be to initiate Coumadin therapy in approximately          two weeks, would work concurrently with cardiology to initiate this          therapy.  2.  New paroxysmal atrial fibrillation quite refractory over the course of      hospitalization.      1.  Initiated Cardizem drip without rate control achieved.      2.  Converted to oral Cardizem and initiated amiodarone drip with          conversion to normal sinus rhythm. Discharge amiodarone and Cardizem          as well as beta-blocker therapy concurrently.      3.  Will need to contact [facility] post discharge.          Call [phone] for an appointment to follow up coronary artery          disease, planned initiation to Coumadin therapy in approximately two          weeks, and atrial fibrillation rate control.  3.  Hypokalemia, likely secondary to IV fluids,  resolved with replacement.      Discharge potassium 3.9.      1.  Follow up at [facility] one to two weeks post discharge.          Request repeat basic metabolic panel to follow at that time.  4.  Hypertension with blood pressures stable.  5.  History of sleep apnea with nocturnal CPAP use.  6.  Acute sinusitis with patient reporting prior history recurrent      sinusitis.      1.  Initiated 10-day course  of Septra antibiotic therapy prior to          hospital discharge to complete as outpatient.      2.  Patient states he has had three nasal surgeries previously.  7.  History of osteoarthritis.  8.  History of hyperlipidemia on Statin.  9.  Surgical history includes multiple musculoskeletal repair,      cholecystectomy, appendectomy, exploratory laparotomy.  10. Allergies listed:  PENICILLIN.  This with anaphylactic shock.  11. Code status was full code over the course of hospitalization.   DISCHARGE MEDICATIONS:  1.  Pravachol 80 mg daily in p.m.  2.  Restasis 0.05% ophthalmic solution one drop in each eye daily.  3.  Allegra 180 mg daily.  4.  Flonase one puff into each nostril twice daily.  5.  Enteric coated aspirin 325 mg daily.  6.  Plavix 75 mg daily.  7.  Astelin 137 mcg nasal spray one puff into each nostril twice daily.  8.  Protonix 40 mg daily.  9.  Toprol XL 100 mg daily.  10. Altace 5 mg daily.  11. Lodine 200 mg daily.  12. Septra DS one tablet twice daily to complete a 10-day course of      antibiotic therapy.  13. Cardizem CD 240 mg daily.  14. Amiodarone 400 mg daily.  15. Saline nasal spray one puff into each nostril as needed.   SPECIAL DISCHARGE INSTRUCTIONS:  1.  Disposition:  Discharge home where he lives independently.  He is      followed as an outpatient at [facility], Dr. Maxwell Caul.  2.  Activity:  No restriction.  3.  Diet:  Low fat, low cholesterol, low sodium.  4.  Follow-up [facility] post  discharge, call [phone].  5.  Follow-up [facility] one to two weeks post discharge, call [phone].  6.  Per cardiology consult, Coumadin therapy should be started approximately      two weeks post discharge.  Would work concurrently with cardiology      regarding disposition of aspirin and Plavix at that point.  7.  Would recheck a basic metabolic panel next office visit at [facility].  8.  Would follow liver and thyroid function routinely on new amiodarone      therapy.  9.  Note previous dosing of Lodine has been significantly decreased.   CONSULTATION:  [facility] including Dr. Elsie Lincoln.   PROCEDURES:  1.  CT scan angiogram of the chest May 14, 2005, notes no evidence of      pulmonary emboli.  There was mild cardiac enlargement and small pleural      effusions with bibasilar atelectasis.  Marked peribronchial thickening      and interstitial thickening along the bronchovascular bundles.  Findings      suggestive of bronchitis or atypical interstitial pneumonitis but no      focal pneumonia or pulmonary edema.  Note there was no clinical evidence      of pneumonia or bronchitis over the course of hospitalization.  2.  Cardiac catheterization with stenting LAD May 14, 2005.  Full report as      above.   DISCHARGE LABORATORY DATA:  CBC done May 19, 2005, found wbc 10.2,  hemoglobin 13.5, hematocrit 39.9, platelets 223.  Hemoglobin A1c 5.8.  Basic  metabolic panel May 19, 2005, found sodium  136, potassium 3.9, chloride  107, CO2 25, BUN 13, and creatinine 1.1.  Serum magnesium was normal range  2.1.  Cardiac enzymes noted normal CK and MB with mildly elevated 0.19 to  0.28 range.  Lipid panel with total cholesterol 169, HDL low 35, LDL high  109.  CA15-3 32.  Baseline TSH 1.701.   Radiology as above.   HISTORY OF PRESENT ILLNESS:  A 70 year old male followed as an outpatient at [facility] by Dr. Maxwell Caul.   He presented with complaints  of dyspnea on exertion, shortness of breath, and chest pain.  He was having  some orthopnea.  Morning diaphoresis and some chest pain with exertion over  the left to right chest which was dull in nature and pressure-like.  Patient  denied any leg swelling or pain.  Denied any sensation palpitation.  Patient  does have a noted history of obstructive sleep apnea and utilizes nighttime  CPAP.  He has no prior history of coronary artery disease, angina, etc.  He  was admitted for further evaluation and treatment.   For dictated past medical and surgical history, family history, social  history, review of systems, admission medications, laboratory data, physical  examination, impressions and plan see dictated admission history and  physical dated May 13, 2005.   HOSPITAL COURSE:  PROBLEM #1 -  CORONARY ARTERY DISEASE WITH SYMPTOMS  CONSISTENT WITH ANGINA:  Patient presented as per history of present illness  above.  A pulmonary emboli was a rule out diagnosis and a CT scan angiogram  of the chest was done May 14, 2005.  This noted no CT scan evidence of  pulmonary emboli with full report as above.  Coronary artery disease was a  rule out diagnosis and cardiac enzymes were obtained.  These noted normal CK-  MB but mild elevation serum troponin at 0.19 to 0.28 range.  Consult was  obtained with [facility].  Dr. Elsie Lincoln saw the patient  and took him for cardiac catheterization on May 14, 2005.  He noted a 75%  proximal LAD stenosis.  Placed a percutaneous coronary artery stent at the  site, reducing this blockage to 0%.  Patient tolerated the procedure well  but a secondary finding was paroxysmal atrial fibrillation as discussed  below.  The patient was placed on Plavix over the course of hospitalization.  His previous aspirin was increased.  He was placed on ACE inhibitor and beta-  blocker.  Because of atrial fibrillation, Coumadin was  considered but  cardiology requests this be deferred for approximately two weeks.  Coumadin  will need to be initiated in that time frame.  Would work concurrently with  cardiology regarding initiation of Coumadin therapy and ultimate disposition  of aspirin and Plavix.  Patient is placed on Protonix as GI prophylaxis.   PROBLEM #2 -  NEW PAROXYSMAL ATRIAL FIBRILLATION:  Again with cardiac  catheterization noted paroxysmal atrial fibrillation.  The patient was  followed closely on telemetry and placed on a Cardizem drip.  This did not  achieve rate control and Cardizem was switched to oral form and was  initiated on amiodarone drip.  With amiodarone, the patient converted to  normal sinus rhythm and has subsequently been switched to oral amiodarone as  well.  There have been no problems with bradycardia or hypotension.  Again,  Coumadin should be initiated in approximately two weeks.  He is currently on aspirin and Plavix only.  He did receive Lovenox prophylaxis  during  hospitalization, though now with normal sinus rhythm I am not going to  continue this post discharge.   PROBLEM #3 -  HYPOKALEMIA:  Patient experienced mild hypokalemia but likely  due to IV fluids.  With supplementation, this resolved and prior to  discharge, the patient's serum potassium is normalized.  There is no  significant renal dysfunction.  Would simply follow with a basic metabolic  panel in approximately two weeks at next office visit at [facility].   PROBLEM #4 -  HYPERTENSION:  Blood pressures remain stable on current  medications.   PROBLEM #5 -  HISTORY OF SLEEP APNEA:  Patient continued with nocturnal CPAP  over hospitalization and this will continue for home use.   PROBLEM #6 -  ACUTE SINUSITIS:  Patient developed a purulent nasal mucus  with some blood.  He has a history of a recurrent sinusitis.  He tells me he  has had three previous surgeries in an attempt to correct this but the   problem continues.  He states he previously utilized Biaxin for these  infections though this is not an appropriate antibiotic choice considering  concurrent amiodarone.  I have placed the patient on Septra DS for a 10-day  course and this should be followed up next office visit at [facility] in one to two weeks to assure that this is cleared.   PROBLEM #7 -  HYPERLIPIDEMIA:  Patient has a history of hyperlipidemia on  chronic Statin therapy.  His lipid panel is posted above.  He continues on  Statin therapy post discharge.   PROBLEM #8 -  CODE STATUS:  Patient is full code over the course of  hospitalization.   DISPOSITION:  Patient is discharged home where he lives independently.  Follow-up as above.   ACTIVITY:  No restrictions.   DISCHARGE DIET:  Low fat, low cholesterol, low sodium.   CONDITION ON DISCHARGE:  Stable/improved.   Discharge process greater than 30 minutes, 8 o'clock a.m. through 9 o'clock  a.m.      Everett Graff, N.P.      Lonia Blood, M.D.  Electronically Signed    TC/MEDQ  D:  05/21/2005  T:  05/21/2005  Job:  [id number]   cc:   [facility]

## 2011-04-16 NOTE — Op Note (Signed)
   NAME:  ARNOLD, KESTER NO.:  [id number]   MEDICAL RECORD NO.:  [id number]                   PATIENT TYPE:  AMB   LOCATION:  ENDO                                 FACILITY:  [facility]   PHYSICIAN:  Georgiana Spinner, M.D.                 DATE OF BIRTH:  1941/11/20   DATE OF PROCEDURE:  05/07/2003  DATE OF DISCHARGE:                                 OPERATIVE REPORT   PROCEDURE PERFORMED:  Upper endoscopy.   ENDOSCOPIST:  Georgiana Spinner, M.D.   INDICATIONS FOR PROCEDURE:  Gastroesophageal reflux disease.   ANESTHESIA:  Demerol 10 mg, Versed 1 mg.   DESCRIPTION OF PROCEDURE:  With the patient mildly sedated in the left  lateral decubitus position, the Olympus video endoscope was inserted in the  mouth and passed under direct vision through the esophagus which appeared  normal until we reached the distal esophagus and there was a flame of tissue  extending cephalad which was photographed and biopsied.  We entered into the  stomach through a hiatal hernia which was photographed.  The fundus, body,  antrum, duodenal bulb and second portion of the duodenum all appeared  normal.  From this point, the endoscope was slowly withdrawn taking  circumferential views of the entire duodenal mucosa until the endoscope was  pulled back into the stomach and placed on retroflexion to view the stomach  from below and incomplete wrap of the gastroesophageal junction around the  endoscope was seen and photographed.  The endoscope was then straightened  and withdrawn taking circumferential views of the remaining gastric and  esophageal mucosa.  The patient's vital signs and pulse oximeter remained  stable.  The patient tolerated the procedure well without apparent  complications.   FINDINGS:  Changes of distal esophagus biopsied above a hiatal hernia. Await  biopsy report.  The patient will call me for results and follow up with me  as an outpatient.   PLAN:                                   Georgiana Spinner, M.D.    GMO/MEDQ  D:  05/07/2003  T:  05/07/2003  Job:  [id number]

## 2013-03-08 ENCOUNTER — Ambulatory Visit (INDEPENDENT_AMBULATORY_CARE_PROVIDER_SITE_OTHER): Payer: Medicare Other | Admitting: Nurse Practitioner

## 2013-03-08 ENCOUNTER — Encounter: Payer: Self-pay | Admitting: Nurse Practitioner

## 2013-03-08 VITALS — BP 124/62 | HR 70 | Temp 98.2°F | Resp 15 | Ht 65.0 in | Wt 190.8 lb

## 2013-03-08 DIAGNOSIS — G4700 Insomnia, unspecified: Secondary | ICD-10-CM

## 2013-03-08 DIAGNOSIS — F329 Major depressive disorder, single episode, unspecified: Secondary | ICD-10-CM | POA: Insufficient documentation

## 2013-03-08 DIAGNOSIS — E039 Hypothyroidism, unspecified: Secondary | ICD-10-CM | POA: Insufficient documentation

## 2013-03-08 DIAGNOSIS — I10 Essential (primary) hypertension: Secondary | ICD-10-CM | POA: Insufficient documentation

## 2013-03-08 DIAGNOSIS — E785 Hyperlipidemia, unspecified: Secondary | ICD-10-CM | POA: Insufficient documentation

## 2013-03-08 MED ORDER — LEVOTHYROXINE SODIUM 100 MCG PO TABS: ORAL_TABLET | ORAL | Status: DC

## 2013-03-08 MED ORDER — BUPROPION HCL ER (SR) 150 MG PO TB12: ORAL_TABLET | ORAL | Status: DC

## 2013-03-08 MED ORDER — CODEINE SULFATE 30 MG PO TABS: 30.0000 mg | ORAL_TABLET | Freq: Four times a day (QID) | ORAL | Status: DC | PRN

## 2013-03-08 MED ORDER — ZOLPIDEM TARTRATE 5 MG PO TABS: 5.0000 mg | ORAL_TABLET | Freq: Every evening | ORAL | Status: DC | PRN

## 2013-03-08 MED ORDER — CITALOPRAM HYDROBROMIDE 40 MG PO TABS: 40.0000 mg | ORAL_TABLET | Freq: Every day | ORAL | Status: DC

## 2013-03-08 MED ORDER — CARVEDILOL 3.125 MG PO TABS: 3.1250 mg | ORAL_TABLET | Freq: Two times a day (BID) | ORAL | Status: DC

## 2013-03-08 MED ORDER — NIACIN ER (ANTIHYPERLIPIDEMIC) 1000 MG PO TBCR: EXTENDED_RELEASE_TABLET | ORAL | Status: DC

## 2013-03-08 MED ORDER — FLUTICASONE PROPIONATE 50 MCG/ACT NA SUSP: 1.0000 | Freq: Two times a day (BID) | NASAL | Status: DC

## 2013-03-08 NOTE — Assessment & Plan Note (Signed)
Patient is stable no signs of hypo or hyperthyroidism at this time TSH stable; continue current regimen. Will monitor and make changes as necessary.

## 2013-03-08 NOTE — Assessment & Plan Note (Signed)
Cont current medications.will cont to monitor and make changes as needed.

## 2013-03-08 NOTE — Assessment & Plan Note (Signed)
Stable on current medications 

## 2013-03-08 NOTE — Assessment & Plan Note (Signed)
Stable on current medications. Will cont medications at this time

## 2013-03-08 NOTE — Progress Notes (Signed)
Patient ID: Derek Sweeney, male   DOB: 10/23/1941, 72 y.o.   MRN: [id number]   Allergies  Allergen Reactions  . Penicillins     Chief Complaint  Patient presents with  . Medical Managment of Chronic Issues  . Medication Refill    HPI: Patient is a 72 y.o. male seen in the office today for medication refills. No current complaint. Pt is still preparing to move to the [employer] due to legal issue with his divorce he has not been able to finalized plans.  REASSESSMENT OF ONGOING PROBLEMS:   244.9-HYPOTHYROIDISM The hypothyroidism is stable.  No complications noted from the medication presently being used. 272.4-HYPERLIPIDEMIA The patient's most recent LDL is at goal.No complications noted from the medication presently being used.  311-DEPRESSIVE DISORDER NEC The depression remains stable. The patient denies excessive crying, a persistent feeling of sadness and hopelessness, and fatigue.No complications noted from the medication presently being used. increased stress with move and separation from wife although he feels like he is handling things okay. no suicidal ideations. taking wellbutrin and celexa   seeing psychologist  327.23-OBSTRUCTIVE SLEEP APNEA (ADULT) (PEDIATRIC)  Unchanged. Uses CPAP. 401.9-HTN UNSPECIFIED The blood pressure readings taken outside the office since the last visit have been in the target range. The patient denies chest pain, shortness of breath, dyspnea on exertion, pedal edema, or headache. 715.96-OSTEOARTHROSIS, KNEE   using codeine as needed for osteoarthritis.  780.52-INSOMNIA, UNSPECIFIED  taking ambien  5mg  qhs and helping him sleep   Review of Systems:  Review of Systems  Constitutional: Negative for fever, chills and malaise/fatigue.  HENT: Positive for hearing loss (severe hearing loss; hearing aids use). Negative for ear pain.   Eyes: Negative.   Respiratory: Negative for cough and shortness of breath.   Cardiovascular: Negative for chest pain and  palpitations.       Sees cardiology   Gastrointestinal: Negative for nausea and vomiting.  Genitourinary: Negative.   Musculoskeletal: Positive for joint pain (left knee injections per ortho (allusio)).  Skin: Negative for itching and rash.  Neurological: Negative.  Negative for weakness and headaches.  Psychiatric/Behavioral: Negative.      Past Medical History  Diagnosis Date  . Disturbance of salivary secretion   . Obesity, unspecified   . Depressive disorder, not elsewhere classified   . Impotence of organic origin   . Pain in limb   . Other malaise and fatigue   . Attention or concentration deficit   . Thrombocytopenia, unspecified   . Gross hematuria   . Pain in joint, forearm   . Benign paroxysmal positional vertigo   . Insomnia, unspecified   . Myopathy, unspecified   . Unspecified hearing loss   . Unspecified hypothyroidism   . Hypertension   . Atrial fibrillation   . Obstructive sleep apnea (adult) (pediatric)   . Hyperlipidemia    Past Surgical History  Procedure Laterality Date  . Tonsillectomy  1947  . Bone spur  1968  . Stomach surgery  1974  . Cholecystectomy  1987  . Appendectomy    . Septoplasty  1989  . Ankle surgery  2005  . Eye surgery  2011    bilateral cataract   Social History:   reports that he has never smoked. He does not have any smokeless tobacco history on file. He reports that he does not drink alcohol or use illicit drugs.  No family history on file.  Medications: Patient's Medications  New Prescriptions   No medications on file  Previous Medications   ASCORBIC ACID (VITAMIN C) 1000 MG TABLET    Take one tablet three times per week for vitamin   ASPIRIN 81 MG TABLET    Take 81 mg by mouth daily.   CALCIUM-VITAMIN D 250-100 MG-UNIT PER TABLET       CHOLECALCIFEROL (VITAMIN D) 2000 UNITS TABLET    Take one tablet once daily for vitamin d   PENNSAID 2 % SOLN      Modified Medications   Modified Medication Previous Medication    BUPROPION (WELLBUTRIN SR) 150 MG 12 HR TABLET buPROPion (WELLBUTRIN SR) 150 MG 12 hr tablet      Take 3 tablets once daily for depression    Take one tablet three times daily for depression   CARVEDILOL (COREG) 3.125 MG TABLET carvedilol (COREG) 3.125 MG tablet      Take 1 tablet (3.125 mg total) by mouth 2 (two) times daily with a meal. Take one tablet twice daily for blood pressure    Take one tablet twice daily for blood pressure   CITALOPRAM (CELEXA) 40 MG TABLET citalopram (CELEXA) 40 MG tablet      Take 1 tablet (40 mg total) by mouth daily. Take one tablet once daily for depression    Take one tablet once daily for depression   CODEINE 30 MG TABLET codeine 30 MG tablet      Take 1 tablet (30 mg total) by mouth every 6 (six) hours as needed for pain. Take one tablet every 6 hours as needed for pain    Take one tablet every 6 hours as needed for pain   FLUTICASONE (FLONASE) 50 MCG/ACT NASAL SPRAY fluticasone (FLONASE) 50 MCG/ACT nasal spray      Place 1 spray into the nose 2 (two) times daily. Two sprays each nostril twice daily for allergies    Two sprays each nostril twice daily for allergies   LEVOTHYROXINE (SYNTHROID) 100 MCG TABLET SYNTHROID 100 MCG tablet      Take one tablet once daily for thyroid    Take one tablet once daily for thyroid   NIACIN (NIASPAN) 1000 MG CR TABLET niacin (NIASPAN) 1000 MG CR tablet      Take one tablet twice daily to lower cholesterol    Take one tablet twice daily to lower cholesterol   ZOLPIDEM (AMBIEN) 5 MG TABLET zolpidem (AMBIEN) 5 MG tablet      Take 1 tablet (5 mg total) by mouth at bedtime as needed for sleep. Take one tablet once at bedtime for rest    Take one tablet once at bedtime for rest  Discontinued Medications   No medications on file     Physical Exam: Physical Exam  Constitutional: He is oriented to person, place, and time. He appears well-developed and well-nourished.  Cardiovascular: Normal rate, regular rhythm and normal heart  sounds.   Pulmonary/Chest: Effort normal and breath sounds normal.  Abdominal: Soft. Bowel sounds are normal.  Musculoskeletal: Normal range of motion.  Neurological: He is alert and oriented to person, place, and time.  Skin: Skin is warm and dry.  Psychiatric: He has a normal mood and affect.     Filed Vitals:   03/08/13 1023  BP: 124/62  Pulse: 70  Temp: 98.2 F (36.8 C)  TempSrc: Oral  Resp: 15  Height: 5\' 5"  (1.651 m)  Weight: 190 lb 12.8 oz (86.546 kg)       Assessment/Plan Unspecified essential hypertension Patient is stable; continue current regimen. Will monitor  and make changes as necessary.   Unspecified hypothyroidism Patient is stable no signs of hypo or hyperthyroidism at this time TSH stable; continue current regimen. Will monitor and make changes as necessary.   Other and unspecified hyperlipidemia Cont current medications.will cont to monitor and make changes as needed.   Depression Stable on current medications. Will cont medications at this time   Insomnia Stable on current medications

## 2013-03-08 NOTE — Assessment & Plan Note (Signed)
Patient is stable; continue current regimen. Will monitor and make changes as necessary.  

## 2013-04-17 ENCOUNTER — Other Ambulatory Visit: Payer: Self-pay | Admitting: *Deleted

## 2013-04-17 MED ORDER — ZOLPIDEM TARTRATE 5 MG PO TABS: ORAL_TABLET | ORAL | Status: DC

## 2013-04-17 MED ORDER — CODEINE SULFATE 30 MG PO TABS: ORAL_TABLET | ORAL | Status: DC

## 2013-06-14 ENCOUNTER — Other Ambulatory Visit: Payer: Self-pay | Admitting: Geriatric Medicine

## 2013-06-14 MED ORDER — BUPROPION HCL ER (SR) 150 MG PO TB12: ORAL_TABLET | ORAL | Status: DC

## 2013-06-14 MED ORDER — ZOLPIDEM TARTRATE 5 MG PO TABS: ORAL_TABLET | ORAL | Status: DC

## 2013-06-14 MED ORDER — CODEINE SULFATE 30 MG PO TABS: ORAL_TABLET | ORAL | Status: DC

## 2013-06-28 ENCOUNTER — Ambulatory Visit (INDEPENDENT_AMBULATORY_CARE_PROVIDER_SITE_OTHER): Payer: Medicare Other | Admitting: Nurse Practitioner

## 2013-06-28 ENCOUNTER — Encounter: Payer: Self-pay | Admitting: Nurse Practitioner

## 2013-06-28 VITALS — BP 120/82 | HR 78 | Temp 98.5°F | Resp 14 | Ht 65.0 in | Wt 195.8 lb

## 2013-06-28 DIAGNOSIS — T148XXA Other injury of unspecified body region, initial encounter: Secondary | ICD-10-CM

## 2013-06-28 NOTE — Progress Notes (Signed)
Patient ID: Derek Sweeney, male   DOB: 04-21-1941, 72 y.o.   MRN: [id number]   Allergies  Allergen Reactions  . Penicillins     Chief Complaint  Patient presents with  . Fall    fell in a cast iron tub on Monday and back now hurts    HPI: Patient is a 72 y.o. male seen in the office today for back hurting  Slipped and fell while trying to get out of the tub- did not have a mat which he was used it-- and hit back 4 days ago. Was very painful; next day it was the same has not had much improvement. Tender to touch on the spot-- does not radiate  Has used ice twice. Has not tired any other medication other than codeine. Better when he lays down or stands up straight it gets better. Worse when he trys to pick something up   Review of Systems:  Review of Systems  Constitutional: Negative for fever, chills and malaise/fatigue.  Respiratory: Negative for cough and shortness of breath.   Cardiovascular: Negative for chest pain and palpitations.  Musculoskeletal: Positive for myalgias and joint pain.  Skin:       bruising at the site  Neurological: Negative for weakness.     Past Medical History  Diagnosis Date  . Disturbance of salivary secretion   . Obesity, unspecified   . Depressive disorder, not elsewhere classified   . Impotence of organic origin   . Pain in limb   . Other malaise and fatigue   . Attention or concentration deficit   . Thrombocytopenia, unspecified   . Gross hematuria   . Pain in joint, forearm   . Benign paroxysmal positional vertigo   . Insomnia, unspecified   . Myopathy, unspecified   . Unspecified hearing loss   . Unspecified hypothyroidism   . Hypertension   . Atrial fibrillation   . Obstructive sleep apnea (adult) (pediatric)   . Hyperlipidemia    Past Surgical History  Procedure Laterality Date  . Tonsillectomy  1947  . Bone spur  1968  . Stomach surgery  1974  . Cholecystectomy  1987  . Appendectomy    . Septoplasty  1989  . Ankle  surgery  2005  . Eye surgery  2011    bilateral cataract   Social History:   reports that he has never smoked. He does not have any smokeless tobacco history on file. He reports that he does not drink alcohol or use illicit drugs.  No family history on file.  Medications: Patient's Medications  New Prescriptions   No medications on file  Previous Medications   ASCORBIC ACID (VITAMIN C) 1000 MG TABLET    Take one tablet three times per week for vitamin   ASPIRIN 81 MG TABLET    Take 81 mg by mouth daily.   BUPROPION (WELLBUTRIN SR) 150 MG 12 HR TABLET    Take 3 tablets once daily for depression   CALCIUM-VITAMIN D 250-100 MG-UNIT PER TABLET       CARVEDILOL (COREG) 3.125 MG TABLET    Take 1 tablet (3.125 mg total) by mouth 2 (two) times daily with a meal. Take one tablet twice daily for blood pressure   CHOLECALCIFEROL (VITAMIN D) 2000 UNITS TABLET    Take one tablet once daily for vitamin d   CITALOPRAM (CELEXA) 40 MG TABLET    Take 1 tablet (40 mg total) by mouth daily. Take one tablet once daily for depression  CODEINE 30 MG TABLET    Take one tablet every 6 hours as needed for pain   FLUTICASONE (FLONASE) 50 MCG/ACT NASAL SPRAY    Place 1 spray into the nose 2 (two) times daily. Two sprays each nostril twice daily for allergies   LEVOTHYROXINE (SYNTHROID) 100 MCG TABLET    Take one tablet once daily for thyroid   NIACIN (NIASPAN) 1000 MG CR TABLET    Take one tablet twice daily to lower cholesterol   ZOLPIDEM (AMBIEN) 5 MG TABLET    Take one tablet once at bedtime for rest  Modified Medications   No medications on file  Discontinued Medications   PENNSAID 2 % SOLN         Physical Exam:  Filed Vitals:   06/28/13 1139  BP: 120/82  Pulse: 78  Temp: 98.5 F (36.9 C)  TempSrc: Oral  Resp: 14  Height: 5\' 5"  (1.651 m)  Weight: 195 lb 12.8 oz (88.814 kg)    Physical Exam  Vitals reviewed. Constitutional: He is oriented to person, place, and time and well-developed,  well-nourished, and in no distress. No distress.  Cardiovascular: Normal rate, regular rhythm and normal heart sounds.   Pulmonary/Chest: Effort normal and breath sounds normal. No respiratory distress. He exhibits no tenderness.  Abdominal: Soft. Bowel sounds are normal. He exhibits no distension. There is no tenderness.  Musculoskeletal: Normal range of motion. He exhibits no edema and no tenderness.  Neurological: He is alert and oriented to person, place, and time.  Skin: Skin is warm and dry. He is not diaphoretic.  Tender on deep palpitation to lateral aspect abdomen distal to rib cage on right side. Small bruising noted distally to tender area. No tenderness to ribs or spine    Assessment/Plan 1. Contusion of unspecified site To apply heat to area as needed Rest and Do not over do it-- if it is causing strain to pick something up do not attempt May use aleve 1 tablet ever 8 hours for 5 days  To follow up if pain get worse

## 2013-06-28 NOTE — Patient Instructions (Signed)
Use heating pad as needed for pain  May use Aleve 1 tablet every 8 hours for 5-7 days   Contusion A contusion is a deep bruise. Contusions are the result of an injury that caused bleeding under the skin. The contusion may turn blue, purple, or yellow. Minor injuries will give you a painless contusion, but more severe contusions may stay painful and swollen for a few weeks.  CAUSES  A contusion is usually caused by a blow, trauma, or direct force to an area of the body. SYMPTOMS   Swelling and redness of the injured area.  Bruising of the injured area.  Tenderness and soreness of the injured area.  Pain. DIAGNOSIS  The diagnosis can be made by taking a history and physical exam. An X-ray, CT scan, or MRI may be needed to determine if there were any associated injuries, such as fractures. TREATMENT  Specific treatment will depend on what area of the body was injured. In general, the best treatment for a contusion is resting, icing, elevating, and applying cold compresses to the injured area. Over-the-counter medicines may also be recommended for pain control. Ask your caregiver what the best treatment is for your contusion. HOME CARE INSTRUCTIONS   Put ice on the injured area.  Put ice in a plastic bag.  Place a towel between your skin and the bag.  Leave the ice on for 15-20 minutes, 3-4 times a day.  Only take over-the-counter or prescription medicines for pain, discomfort, or fever as directed by your caregiver. Your caregiver may recommend avoiding anti-inflammatory medicines (aspirin, ibuprofen, and naproxen) for 48 hours because these medicines may increase bruising.  Rest the injured area.  If possible, elevate the injured area to reduce swelling. SEEK IMMEDIATE MEDICAL CARE IF:   You have increased bruising or swelling.  You have pain that is getting worse.  Your swelling or pain is not relieved with medicines. MAKE SURE YOU:   Understand these instructions.  Will  watch your condition.  Will get help right away if you are not doing well or get worse. Document Released: 08/25/2005 Document Revised: 02/07/2012 Document Reviewed: 09/20/2011 [facility] Patient Information 2014 [place], [place].

## 2013-07-06 ENCOUNTER — Telehealth: Payer: Self-pay | Admitting: Geriatric Medicine

## 2013-07-06 NOTE — Progress Notes (Signed)
Derek Sweeney's left ribs do not feel any better. I am going to call him and schedule an appointment for him to come in and been seen again.

## 2013-07-09 ENCOUNTER — Encounter: Payer: Self-pay | Admitting: Nurse Practitioner

## 2013-07-09 ENCOUNTER — Ambulatory Visit (INDEPENDENT_AMBULATORY_CARE_PROVIDER_SITE_OTHER): Payer: Medicare Other | Admitting: Nurse Practitioner

## 2013-07-09 VITALS — BP 130/60 | HR 55 | Temp 98.0°F | Resp 18 | Ht 65.0 in | Wt 190.2 lb

## 2013-07-09 DIAGNOSIS — T148XXA Other injury of unspecified body region, initial encounter: Secondary | ICD-10-CM

## 2013-07-09 NOTE — Progress Notes (Signed)
Patient ID: Derek Sweeney, male   DOB: 1941-09-26, 72 y.o.   MRN: [id number]   Allergies  Allergen Reactions  . Penicillins     Chief Complaint  Patient presents with  . Follow-up    HPI: Patient is a 72 y.o. male seen in the office today for follow up on fall. Made appt on Friday he still had a lot of stiffness and pain. Did not think it would take this long for his body to recover. reports his whole body is recovering and overall has improved.  Had a fall 15 days ago. Was taking aleve twice daily and using codeine as needed, still feels the pain. Using [facility]  When he is not moving the pain is a 3/10 but when he is moving it is worse. Also knee pain makes walking more painful.   Review of Systems:  Constitutional: Negative for fever, chills and malaise/fatigue.  Respiratory: Negative for cough and shortness of breath.  Cardiovascular: Negative for chest pain and palpitations. GI/GU: no loss of bowel or bladder  Musculoskeletal: Positive for myalgias and joint pain.  Skin: bruising at the site  Neurological: Negative for weakness.    Past Medical History  Diagnosis Date  . Disturbance of salivary secretion   . Obesity, unspecified   . Depressive disorder, not elsewhere classified   . Impotence of organic origin   . Pain in limb   . Other malaise and fatigue   . Attention or concentration deficit   . Thrombocytopenia, unspecified   . Gross hematuria   . Pain in joint, forearm   . Benign paroxysmal positional vertigo   . Insomnia, unspecified   . Myopathy, unspecified   . Unspecified hearing loss   . Unspecified hypothyroidism   . Hypertension   . Atrial fibrillation   . Obstructive sleep apnea (adult) (pediatric)   . Hyperlipidemia    Past Surgical History  Procedure Laterality Date  . Tonsillectomy  1947  . Bone spur  1968  . Stomach surgery  1974  . Cholecystectomy  1987  . Appendectomy    . Septoplasty  1989  . Ankle surgery  2005  . Eye surgery  2011     bilateral cataract   Social History:   reports that he has never smoked. He does not have any smokeless tobacco history on file. He reports that he does not drink alcohol or use illicit drugs.  No family history on file.  Medications: Patient's Medications  New Prescriptions   No medications on file  Previous Medications   ASCORBIC ACID (VITAMIN C) 1000 MG TABLET    Take one tablet three times per week for vitamin   ASPIRIN 81 MG TABLET    Take 81 mg by mouth daily.   BUPROPION (WELLBUTRIN SR) 150 MG 12 HR TABLET    Take 3 tablets once daily for depression   CALCIUM-VITAMIN D 250-100 MG-UNIT PER TABLET       CARVEDILOL (COREG) 3.125 MG TABLET    Take 1 tablet (3.125 mg total) by mouth 2 (two) times daily with a meal. Take one tablet twice daily for blood pressure   CHOLECALCIFEROL (VITAMIN D) 2000 UNITS TABLET    Take one tablet once daily for vitamin d   CITALOPRAM (CELEXA) 40 MG TABLET    Take 1 tablet (40 mg total) by mouth daily. Take one tablet once daily for depression   CODEINE 30 MG TABLET    Take one tablet every 6 hours as needed for  pain   FLUTICASONE (FLONASE) 50 MCG/ACT NASAL SPRAY    Place 1 spray into the nose 2 (two) times daily. Two sprays each nostril twice daily for allergies   LEVOTHYROXINE (SYNTHROID) 100 MCG TABLET    Take one tablet once daily for thyroid   NIACIN (NIASPAN) 1000 MG CR TABLET    Take one tablet twice daily to lower cholesterol   ZOLPIDEM (AMBIEN) 5 MG TABLET    Take one tablet once at bedtime for rest  Modified Medications   No medications on file  Discontinued Medications   No medications on file     Physical Exam:  Filed Vitals:   07/09/13 1254  BP: 130/60  Pulse: 55  Temp: 98 F (36.7 C)  TempSrc: Oral  Resp: 18  Height: 5\' 5"  (1.651 m)  Weight: 190 lb 3.2 oz (86.274 kg)  SpO2: 95%    Physical Exam  Vitals reviewed.  Constitutional: He is oriented to person, place, and time and well-developed, well-nourished, and in no  distress. No distress.  Cardiovascular: Normal rate, regular rhythm and normal heart sounds.  Pulmonary/Chest: Effort normal and breath sounds normal. No respiratory distress. He exhibits no tenderness.  Abdominal: Soft. Bowel sounds are normal. He exhibits no distension. There is no tenderness.  Musculoskeletal: Normal range of motion. He exhibits no edema and no tenderness.  Neurological: He is alert and oriented to person, place, and time.  Skin: Skin is warm and dry. He is not diaphoretic.  Remains tender on deep palpitation to lateral aspect abdomen distal to rib cage on right side. Small bruising noted distally to tender area. No tenderness spine or paraspinal muscles     Assessment/Plan Contusion Improved but slowly- educated this could take several weeks and months, given the option to get xray due to tenderness near ribs but he declined since this would not change treatment plan. May use heat or salon pas patch or bifreeze, bengay or icy-hot if needed  pt to follow up in 4-6 weeks for RV

## 2013-07-09 NOTE — Patient Instructions (Signed)
May use salonpas patch Or Biofreeze, Bengay, or icy- hot Use heating pad as needed   Follow up in 4-6 weeks for routine visit.

## 2013-07-17 ENCOUNTER — Other Ambulatory Visit: Payer: Self-pay | Admitting: Geriatric Medicine

## 2013-07-17 MED ORDER — CODEINE SULFATE 30 MG PO TABS: ORAL_TABLET | ORAL | Status: DC

## 2013-07-17 MED ORDER — ZOLPIDEM TARTRATE 5 MG PO TABS: ORAL_TABLET | ORAL | Status: DC

## 2013-08-02 ENCOUNTER — Encounter: Payer: Self-pay | Admitting: *Deleted

## 2013-08-03 ENCOUNTER — Encounter: Payer: Self-pay | Admitting: *Deleted

## 2013-08-06 ENCOUNTER — Encounter: Payer: Self-pay | Admitting: Nurse Practitioner

## 2013-08-06 ENCOUNTER — Ambulatory Visit (INDEPENDENT_AMBULATORY_CARE_PROVIDER_SITE_OTHER): Payer: Medicare Other | Admitting: Nurse Practitioner

## 2013-08-06 VITALS — BP 120/78 | HR 71 | Temp 98.2°F | Ht 65.5 in | Wt 193.0 lb

## 2013-08-06 DIAGNOSIS — E785 Hyperlipidemia, unspecified: Secondary | ICD-10-CM

## 2013-08-06 DIAGNOSIS — E039 Hypothyroidism, unspecified: Secondary | ICD-10-CM

## 2013-08-06 DIAGNOSIS — I10 Essential (primary) hypertension: Secondary | ICD-10-CM

## 2013-08-06 DIAGNOSIS — Z23 Encounter for immunization: Secondary | ICD-10-CM

## 2013-08-06 DIAGNOSIS — F3289 Other specified depressive episodes: Secondary | ICD-10-CM

## 2013-08-06 DIAGNOSIS — F329 Major depressive disorder, single episode, unspecified: Secondary | ICD-10-CM

## 2013-08-06 DIAGNOSIS — G4700 Insomnia, unspecified: Secondary | ICD-10-CM

## 2013-08-06 NOTE — Progress Notes (Signed)
Patient ID: Derek Sweeney, male   DOB: 02-28-1941, 72 y.o.   MRN: [id number]   Allergies  Allergen Reactions  . Penicillins     Chief Complaint  Patient presents with  . Medical Managment of Chronic Issues    follow-up on bruised rib on right side     HPI: Patient is a 72 y.o. male seen in the office today for routine follow up REASSESSMENT OF ONGOING PROBLEMS:    HYPOTHYROIDISM The hypothyroidism is stable.  No complications noted from the medication presently being used. Taking  HYPERLIPIDEMIA The patient's most recent LDL is at goal.No complications noted from the medication presently being used.  taking fish oil and niacin daily  DEPRESSIVE DISORDER NEC The depression remains stable. The patient denies excessive crying, a persistent feeling of sadness and hopelessness, and fatigue.No complications noted from the medication presently being used. Pt still reports increased stress with move and separation from wife although he feels like he is handling things and seeing psychologist every other week.  taking wellbutrin and celexa.  OBSTRUCTIVE SLEEP APNEA  Unchanged. Uses CPAP. HTN UNSPECIFIED The blood pressure readings taken outside the office since the last visit have been in the target range. The patient denies chest pain, shortness of breath, dyspnea on exertion, pedal edema, or headache. OSTEOARTHROSIS, KNEE   using codeine as needed for osteoarthritis.   INSOMNIA, UNSPECIFIED  stable- taking ambien  5mg  qhs and helping him sleep   Review of Systems:  Review of Systems  Constitutional: Negative for fever, chills and malaise/fatigue.  HENT: Positive for hearing loss.   Respiratory: Negative for shortness of breath.   Cardiovascular: Negative for chest pain and palpitations.  Gastrointestinal: Negative for heartburn, abdominal pain, diarrhea and constipation.  Genitourinary: Negative for dysuria, urgency and frequency.  Musculoskeletal: Positive for joint pain (ankle and knee  pain).  Skin: Negative for itching and rash.  Neurological: Negative for weakness and headaches.  Psychiatric/Behavioral:       Long standing history of depression- this has been stable     Past Medical History  Diagnosis Date  . Disturbance of salivary secretion   . Osteoarthrosis, unspecified whether generalized or localized, lower leg   . Depressive disorder, not elsewhere classified   . Impotence of organic origin   . Pain in limb   . Other malaise and fatigue   . Attention or concentration deficit   . Thrombocytopenia, unspecified   . Gross hematuria   . Pain in joint, forearm   . Benign paroxysmal positional vertigo   . Insomnia, unspecified   . Myopathy, unspecified   . Unspecified hearing loss   . Unspecified hypothyroidism   . Hypertension   . Atrial fibrillation   . Obstructive sleep apnea (adult) (pediatric)   . Hyperlipidemia   . Cataract 02/2010     bilateral  [name]   Past Surgical History  Procedure Laterality Date  . Tonsillectomy  1947  . Bone spur  1968  . Stomach surgery  1974  . Cholecystectomy  1987  . Appendectomy    . Septoplasty  1989  . Ankle surgery  2005  . Eye surgery  2011    bilateral cataract  . Wrist surgery Right 04/24/2010    Gramig MD  . Nasal sinus surgery  1995    polys removed  . Exploratory laparotomy     Social History:   reports that he has never smoked. He does not have any smokeless tobacco history on file. He reports that  he does not drink alcohol or use illicit drugs.  Family History  Problem Relation Age of Onset  . Cancer Father     colon    Medications: Patient's Medications  New Prescriptions   No medications on file  Previous Medications   ASCORBIC ACID (VITAMIN C) 1000 MG TABLET    Take one tablet three times per week for vitamin   ASPIRIN 81 MG TABLET    Take 81 mg by mouth daily.   BUPROPION (WELLBUTRIN SR) 150 MG 12 HR TABLET    Take 3 tablets once daily for depression   CALCIUM-VITAMIN D 250-100  MG-UNIT PER TABLET    Take 1 tablet by mouth 2 (two) times daily.    CARVEDILOL (COREG) 3.125 MG TABLET    Take 1 tablet (3.125 mg total) by mouth 2 (two) times daily with a meal. Take one tablet twice daily for blood pressure   CHOLECALCIFEROL (VITAMIN D3) LIQD    by Does not apply route. 1 drop daily   CITALOPRAM (CELEXA) 40 MG TABLET    Take 1 tablet (40 mg total) by mouth daily. Take one tablet once daily for depression   CODEINE 30 MG TABLET    Take one tablet every 6 hours as needed for pain   FLUTICASONE (FLONASE) 50 MCG/ACT NASAL SPRAY    Place 1 spray into the nose 2 (two) times daily. Two sprays each nostril twice daily for allergies   LEVOTHYROXINE (SYNTHROID) 100 MCG TABLET    Take one tablet once daily for thyroid   MULTIPLE VITAMINS-MINERALS (MULTI VITAMIN/MINERALS PO)    Take by mouth daily.   NIACIN (NIASPAN) 1000 MG CR TABLET    Take one tablet twice daily to lower cholesterol   SODIUM CHLORIDE (OCEAN) 0.65 % NASAL SPRAY    Place 1 spray into the nose as needed for congestion.   ZOLPIDEM (AMBIEN) 5 MG TABLET    Take one tablet once at bedtime for rest  Modified Medications   No medications on file  Discontinued Medications   CALCIUM CITRATE 250 MG TABS    Take 250 mg by mouth daily. Take 1 tablet three times daily for bones.   CHOLECALCIFEROL (VITAMIN D) 2000 UNITS TABLET    Take one tablet once daily for vitamin d     Physical Exam:  Filed Vitals:   08/06/13 1440  BP: 120/78  Pulse: 71  Temp: 98.2 F (36.8 C)  TempSrc: Oral  Height: 5' 5.5" (1.664 m)  Weight: 193 lb (87.544 kg)  SpO2: 95%    Physical Exam  Constitutional: He is well-developed, well-nourished, and in no distress. No distress.  HENT:  Head: Normocephalic and atraumatic.  Mouth/Throat: Oropharynx is clear and moist. No oropharyngeal exudate.  Eyes: Conjunctivae and EOM are normal. Pupils are equal, round, and reactive to light.  Cardiovascular: Normal rate, regular rhythm and normal heart sounds.    Pulmonary/Chest: Effort normal and breath sounds normal. No respiratory distress.  Abdominal: Soft. Bowel sounds are normal. He exhibits no distension.  Musculoskeletal: Normal range of motion. He exhibits no edema and no tenderness.  Neurological: He is alert.  Skin: Skin is warm and dry. He is not diaphoretic. No erythema.     Assessment/Plan 1. Unspecified essential hypertension Stable on current medications. Will follow up blood work - CBC With differential/Platelet; Future - Comprehensive metabolic panel; Future  2. Unspecified hypothyroidism Will get labs - TSH; Future  3. Other and unspecified hyperlipidemia Pt not fasting today will come in  tomorrow for fasting labs  - Comprehensive metabolic panel; Future - Lipid panel; Future  4. Insomnia Stable with current medication  5. Depression Worse due to stress of separation and moving; conts to see psychologist and medication   6. Need for prophylactic vaccination and inoculation against influenza Flu vaccine given today.    To follow up in 3 months if still in town

## 2013-08-07 ENCOUNTER — Other Ambulatory Visit: Payer: Medicare Other

## 2013-08-08 ENCOUNTER — Telehealth: Payer: Self-pay

## 2013-08-08 LAB — CBC WITH DIFFERENTIAL
Basophils Absolute: 0 10*3/uL (ref 0.0–0.2)
Eos: 2 % (ref 0–5)
HCT: 39.2 % (ref 37.5–51.0)
Immature Granulocytes: 0 % (ref 0–2)
Lymphocytes Absolute: 2.4 10*3/uL (ref 0.7–3.1)
MCH: 33.9 pg — ABNORMAL HIGH (ref 26.6–33.0)
MCV: 100 fL — ABNORMAL HIGH (ref 79–97)
Monocytes Absolute: 0.6 10*3/uL (ref 0.1–0.9)
Monocytes: 10 % (ref 4–12)
Neutrophils Absolute: 2.3 10*3/uL (ref 1.4–7.0)
Platelets: 104 10*3/uL — ABNORMAL LOW (ref 150–379)
RDW: 13.1 % (ref 12.3–15.4)

## 2013-08-08 LAB — COMPREHENSIVE METABOLIC PANEL
ALT: 101 IU/L — ABNORMAL HIGH (ref 0–44)
Albumin: 3.9 g/dL (ref 3.5–4.8)
BUN: 25 mg/dL (ref 8–27)
CO2: 27 mmol/L (ref 18–29)
Calcium: 9 mg/dL (ref 8.6–10.2)
Chloride: 98 mmol/L (ref 97–108)
Glucose: 103 mg/dL — ABNORMAL HIGH (ref 65–99)
Potassium: 4 mmol/L (ref 3.5–5.2)
Total Protein: 5.7 g/dL — ABNORMAL LOW (ref 6.0–8.5)

## 2013-08-08 LAB — LIPID PANEL
Cholesterol, Total: 139 mg/dL (ref 100–199)
LDL Calculated: 68 mg/dL (ref 0–99)
Triglycerides: 70 mg/dL (ref 0–149)
VLDL Cholesterol Cal: 14 mg/dL (ref 5–40)

## 2013-08-08 NOTE — Telephone Encounter (Signed)
Per Shanda Bumps if the physician needs to speak with her, please have her contact our office at [phone]. I called the patient and informed him of Jessica's response, patient verbalized understanding

## 2013-08-08 NOTE — Telephone Encounter (Signed)
Patient walked into the office to leave a message for NP, patient states he seen Candyce Churn, DDS MS. Patient followed up with Raynelle Fanning to discuss his concern about grinding his teeth (left- upper/lower). Raynelle Fanning would like to speak with anyone filling/adjusting medications. The following medications Bupropion and Citalopram have side effect of teeth grinding. Dr.Matthews Raynelle Fanning) would like to talk with Candelaria Celeste, NP.   Please call at [phone], Fax [phone]

## 2013-09-05 ENCOUNTER — Telehealth: Payer: Self-pay | Admitting: Cardiovascular Disease

## 2013-09-05 NOTE — Telephone Encounter (Signed)
Call to find out if you received faxs from 9-22 and 9-30? Need this back asap.

## 2013-09-06 NOTE — Telephone Encounter (Signed)
Spoke to [place] w/[facility].INFORMED HER THERE IS A PRESCRIPTION FOR CPAP SUPPLIES  QUESTIONED IS THAT WHAT SHE NEEDS? SHE STATED YES,FAXED PRESCRIPTION. Dr Tresa Endo SIGNED TODAY  COPY PLACED TO BE SCANNED

## 2013-10-10 ENCOUNTER — Telehealth: Payer: Self-pay | Admitting: *Deleted

## 2013-10-10 NOTE — Telephone Encounter (Signed)
Faxed CPAP supply order back. 

## 2013-10-16 ENCOUNTER — Other Ambulatory Visit: Payer: Self-pay | Admitting: *Deleted

## 2013-10-16 DIAGNOSIS — I10 Essential (primary) hypertension: Secondary | ICD-10-CM

## 2013-10-17 ENCOUNTER — Other Ambulatory Visit: Payer: Medicare Other

## 2013-10-17 DIAGNOSIS — I10 Essential (primary) hypertension: Secondary | ICD-10-CM

## 2013-10-18 ENCOUNTER — Encounter: Payer: Self-pay | Admitting: Nurse Practitioner

## 2013-10-18 ENCOUNTER — Ambulatory Visit (INDEPENDENT_AMBULATORY_CARE_PROVIDER_SITE_OTHER): Payer: Medicare Other | Admitting: Nurse Practitioner

## 2013-10-18 VITALS — BP 122/70 | HR 75 | Temp 98.3°F | Resp 14 | Wt 194.6 lb

## 2013-10-18 DIAGNOSIS — E039 Hypothyroidism, unspecified: Secondary | ICD-10-CM

## 2013-10-18 DIAGNOSIS — G4700 Insomnia, unspecified: Secondary | ICD-10-CM

## 2013-10-18 DIAGNOSIS — M1990 Unspecified osteoarthritis, unspecified site: Secondary | ICD-10-CM | POA: Insufficient documentation

## 2013-10-18 DIAGNOSIS — F329 Major depressive disorder, single episode, unspecified: Secondary | ICD-10-CM

## 2013-10-18 DIAGNOSIS — E785 Hyperlipidemia, unspecified: Secondary | ICD-10-CM

## 2013-10-18 DIAGNOSIS — M129 Arthropathy, unspecified: Secondary | ICD-10-CM

## 2013-10-18 DIAGNOSIS — I10 Essential (primary) hypertension: Secondary | ICD-10-CM

## 2013-10-18 LAB — COMPREHENSIVE METABOLIC PANEL
ALT: 44 IU/L (ref 0–44)
AST: 28 IU/L (ref 0–40)
Albumin/Globulin Ratio: 1.8 (ref 1.1–2.5)
Albumin: 3.9 g/dL (ref 3.5–4.8)
Alkaline Phosphatase: 86 IU/L (ref 39–117)
BUN/Creatinine Ratio: 16 (ref 10–22)
BUN: 15 mg/dL (ref 8–27)
CO2: 28 mmol/L (ref 18–29)
Chloride: 100 mmol/L (ref 97–108)
Creatinine, Ser: 0.92 mg/dL (ref 0.76–1.27)
GFR calc Af Amer: 96 mL/min/{1.73_m2} (ref >59)
Sodium: 141 mmol/L (ref 134–144)

## 2013-10-18 MED ORDER — CARVEDILOL 3.125 MG PO TABS: 3.1250 mg | ORAL_TABLET | Freq: Two times a day (BID) | ORAL | Status: DC

## 2013-10-18 MED ORDER — LEVOTHYROXINE SODIUM 100 MCG PO TABS: ORAL_TABLET | ORAL | Status: DC

## 2013-10-18 MED ORDER — ZOLPIDEM TARTRATE 5 MG PO TABS: ORAL_TABLET | ORAL | Status: DC

## 2013-10-18 MED ORDER — BUPROPION HCL ER (SR) 150 MG PO TB12: ORAL_TABLET | ORAL | Status: DC

## 2013-10-18 MED ORDER — CITALOPRAM HYDROBROMIDE 40 MG PO TABS: 40.0000 mg | ORAL_TABLET | Freq: Every day | ORAL | Status: DC

## 2013-10-18 MED ORDER — CODEINE SULFATE 30 MG PO TABS: ORAL_TABLET | ORAL | Status: DC

## 2013-10-18 MED ORDER — NIACIN ER (ANTIHYPERLIPIDEMIC) 1000 MG PO TBCR: EXTENDED_RELEASE_TABLET | ORAL | Status: DC

## 2013-10-18 NOTE — Progress Notes (Signed)
Patient ID: Derek Sweeney, male   DOB: 1941-11-05, 72 y.o.   MRN: [id number]   Allergies  Allergen Reactions  . Penicillins     Chief Complaint  Patient presents with  . Follow-up    f/u labs & discuss concerns on medication and refills   . Immunizations    Tdap <10 yrs and thinks he had the Pneumo about 3 yrs ago.    HPI: Patient is a 72 y.o. male seen in the office today for follow up and refills He is still in [place] but reports not for long; got all papers signed and should be moved soon (finalized divorce)  conts to follow up with prosthodontist due to chipping his tooth;  HYPOTHYROIDISM The hypothyroidism is stable. No complications noted from the medication presently being used.  Lab Results  Component Value Date   TSH 0.889 08/07/2013   HYPERLIPIDEMIA The patient's most recent LDL is at goal.No complications noted from the medication presently being used. taking fish oil and niacin daily  Lab Results  Component Value Date   HDL 57 08/07/2013   LDLCALC 68 08/07/2013   TRIG 70 08/07/2013   CHOLHDL 2.4 08/07/2013    DEPRESSIVE DISORDER NEC The depression remains stable. The patient denies excessive crying, a persistent feeling of sadness and hopelessness, and fatigue.No complications noted from the medication presently being used. Pt still reports increased stress with move and separation from wife although he feels like he is handling things and seeing psychologist every other week. taking wellbutrin and celexa. Thinks celexa is causing him to grind his teeth; started grinding teeth after he was on the medication for several months.  OBSTRUCTIVE SLEEP APNEA Unchanged. Uses CPAP.  HTN UNSPECIFIED  The patient denies chest pain, shortness of breath, dyspnea on exertion, pedal edema, or headache.  OSTEOARTHROSIS, KNEE using codeine as needed for osteoarthritis.  INSOMNIA, UNSPECIFIED stable- taking ambien 5mg  qhs and helping him sleep   Review of Systems:  Review of Systems   Constitutional: Negative for fever, chills and malaise/fatigue.  HENT: Positive for hearing loss.   Respiratory: Negative for shortness of breath.   Cardiovascular: Negative for chest pain and palpitations.  Gastrointestinal: Negative for heartburn, abdominal pain, diarrhea and constipation.  Genitourinary: Negative for dysuria, urgency and frequency.  Musculoskeletal: Positive for joint pain (ankle and knee pain).  Skin: Negative for itching and rash.  Neurological: Negative for weakness and headaches.  Psychiatric/Behavioral: The patient is nervous/anxious and has insomnia.        Long standing history of depression- this has been stable     Past Medical History  Diagnosis Date  . Disturbance of salivary secretion   . Osteoarthrosis, unspecified whether generalized or localized, lower leg   . Depressive disorder, not elsewhere classified   . Impotence of organic origin   . Pain in limb   . Other malaise and fatigue   . Attention or concentration deficit   . Thrombocytopenia, unspecified   . Gross hematuria   . Pain in joint, forearm   . Benign paroxysmal positional vertigo   . Insomnia, unspecified   . Myopathy, unspecified   . Unspecified hearing loss   . Unspecified hypothyroidism   . Hypertension   . Atrial fibrillation   . Obstructive sleep apnea (adult) (pediatric)   . Hyperlipidemia   . Cataract 02/2010     bilateral  Derek Sweeney   Past Surgical History  Procedure Laterality Date  . Tonsillectomy  1947  . Bone spur  1968  .  Stomach surgery  1974  . Cholecystectomy  1987  . Appendectomy    . Septoplasty  1989  . Ankle surgery  2005  . Eye surgery  2011    bilateral cataract  . Wrist surgery Right 04/24/2010    Gramig MD  . Nasal sinus surgery  1995    polys removed  . Exploratory laparotomy     Social History:   reports that he has never smoked. He does not have any smokeless tobacco history on file. He reports that he does not drink alcohol or use illicit  drugs.  Family History  Problem Relation Age of Onset  . Cancer Father     colon    Medications: Patient's Medications  New Prescriptions   No medications on file  Previous Medications   ASCORBIC ACID (VITAMIN C) 1000 MG TABLET    Take one tablet three times per week for vitamin   ASPIRIN 81 MG TABLET    Take 81 mg by mouth daily.   BUPROPION (WELLBUTRIN SR) 150 MG 12 HR TABLET    Take 3 tablets once daily for depression   CALCIUM-VITAMIN D 250-100 MG-UNIT PER TABLET    Take 1 tablet by mouth 2 (two) times daily.    CARVEDILOL (COREG) 3.125 MG TABLET    Take 1 tablet (3.125 mg total) by mouth 2 (two) times daily with a meal. Take one tablet twice daily for blood pressure   CHOLECALCIFEROL (VITAMIN D3) LIQD    by Does not apply route. 1 drop daily   CITALOPRAM (CELEXA) 40 MG TABLET    Take 1 tablet (40 mg total) by mouth daily. Take one tablet once daily for depression   CODEINE 30 MG TABLET    Take one tablet every 6 hours as needed for pain   FLUTICASONE (FLONASE) 50 MCG/ACT NASAL SPRAY    Place 1 spray into the nose 2 (two) times daily. Two sprays each nostril twice daily for allergies   LEVOTHYROXINE (SYNTHROID) 100 MCG TABLET    Take one tablet once daily for thyroid   MULTIPLE VITAMINS-MINERALS (MULTI VITAMIN/MINERALS PO)    Take by mouth daily.   NIACIN (NIASPAN) 1000 MG CR TABLET    Take one tablet twice daily to lower cholesterol   SODIUM CHLORIDE (OCEAN) 0.65 % NASAL SPRAY    Place 1 spray into the nose as needed for congestion.   ZOLPIDEM (AMBIEN) 5 MG TABLET    Take one tablet once at bedtime for rest  Modified Medications   No medications on file  Discontinued Medications   No medications on file     Physical Exam:  Filed Vitals:   10/18/13 1419  BP: 122/70  Pulse: 75  Temp: 98.3 F (36.8 C)  TempSrc: Oral  Resp: 14  Weight: 194 lb 9.6 oz (88.27 kg)  SpO2: 93%    Physical Exam  Constitutional: He is oriented to person, place, and time and well-developed,  well-nourished, and in no distress. No distress.  HENT:  Head: Normocephalic and atraumatic.  Nose: Nose normal.  Mouth/Throat: Oropharynx is clear and moist. No oropharyngeal exudate.  Eyes: Conjunctivae and EOM are normal. Pupils are equal, round, and reactive to light.  Neck: Normal range of motion. Neck supple.  Cardiovascular: Normal rate, regular rhythm and normal heart sounds.   Pulmonary/Chest: Effort normal and breath sounds normal. No respiratory distress.  Abdominal: Soft. Bowel sounds are normal. He exhibits no distension.  Musculoskeletal: Normal range of motion. He exhibits no edema and  no tenderness.  Neurological: He is alert and oriented to person, place, and time. Gait normal.  Skin: Skin is warm and dry. He is not diaphoretic. No erythema.  Psychiatric: Affect normal.     Labs reviewed: Basic Metabolic Panel:  Recent Labs  78/29/56 1154 10/17/13 1149  NA 139 141  K 4.0 4.2  CL 98 100  CO2 27 28  GLUCOSE 103* 86  BUN 25 15  CREATININE 1.02 0.92  CALCIUM 9.0 9.3  TSH 0.889  --    Liver Function Tests:  Recent Labs  08/07/13 1154 10/17/13 1149  AST 55* 28  ALT 101* 44  ALKPHOS 92 86  BILITOT 0.5 0.6  PROT 5.7* 6.1   No results found for this basename: LIPASE, AMYLASE,  in the last 8760 hours No results found for this basename: AMMONIA,  in the last 8760 hours CBC:  Recent Labs  08/07/13 1154  WBC 5.4  NEUTROABS 2.3  HGB 13.3  HCT 39.2  MCV 100*  PLT 104*   Lipid Panel:  Recent Labs  08/07/13 1154  HDL 57  LDLCALC 68  TRIG 70  CHOLHDL 2.4   TSH:  Recent Labs  08/07/13 1154  TSH 0.889   A1C: No components found with this basename: A1C,     Assessment/Plan 1. Unspecified essential hypertension -stable on current medications, cont heart healthy diet and stress reduction, exercise limited due to pain in knees - carvedilol (COREG) 3.125 MG tablet; Take 1 tablet (3.125 mg total) by mouth 2 (two) times daily with a meal.  Take one tablet twice daily for blood pressure  Dispense: 180 tablet; Refill: 1  2. Unspecified hypothyroidism - levothyroxine (SYNTHROID) 100 MCG tablet; Take one tablet once daily for thyroid  Dispense: 90 tablet; Refill: 1  3. Depression With anxiety - buPROPion (WELLBUTRIN SR) 150 MG 12 hr tablet; Take 3 tablets once daily for depression  Dispense: 270 tablet; Refill: 1 - citalopram (CELEXA) 40 MG tablet; Take 1 tablet (40 mg total) by mouth daily. Take one tablet once daily for depression  Dispense: 90 tablet; Refill: 0 - may try to reduce celexa to 20 mg daily for 1 month and then dc but NEEDS to be followed by a provider once he moves regarding anxiety  4. Insomnia -stable on medication - zolpidem (AMBIEN) 5 MG tablet; Take one tablet once at bedtime for rest  Dispense: 30 tablet; Refill: 3  5. Other and unspecified hyperlipidemia - niacin (NIASPAN) 1000 MG CR tablet; Take one tablet twice daily to lower cholesterol  Dispense: 90 tablet; Refill: 1  6. Arthritis -stable on medication - codeine 30 MG tablet; Take one tablet every 6 hours as needed for pain  Dispense: 120 tablet; Refill: 0  Pt to finally make move across country next month to follow up here as needed

## 2013-10-18 NOTE — Patient Instructions (Signed)
May cut celexa in half for 1 month to see if this helps with tooth grinding; if not and anxiety has not worsen may try to stop medication Need to follow up with a provider after doing this

## 2013-10-30 ENCOUNTER — Encounter: Payer: Self-pay | Admitting: Cardiology

## 2013-10-30 ENCOUNTER — Ambulatory Visit (INDEPENDENT_AMBULATORY_CARE_PROVIDER_SITE_OTHER): Payer: Medicare Other | Admitting: Cardiology

## 2013-10-30 VITALS — BP 118/72 | HR 71 | Ht 66.0 in | Wt 192.8 lb

## 2013-10-30 DIAGNOSIS — I2510 Atherosclerotic heart disease of native coronary artery without angina pectoris: Secondary | ICD-10-CM | POA: Insufficient documentation

## 2013-10-30 DIAGNOSIS — I10 Essential (primary) hypertension: Secondary | ICD-10-CM

## 2013-10-30 DIAGNOSIS — E669 Obesity, unspecified: Secondary | ICD-10-CM

## 2013-10-30 DIAGNOSIS — I480 Paroxysmal atrial fibrillation: Secondary | ICD-10-CM | POA: Insufficient documentation

## 2013-10-30 DIAGNOSIS — E785 Hyperlipidemia, unspecified: Secondary | ICD-10-CM

## 2013-10-30 DIAGNOSIS — G4733 Obstructive sleep apnea (adult) (pediatric): Secondary | ICD-10-CM | POA: Insufficient documentation

## 2013-10-30 DIAGNOSIS — Z9861 Coronary angioplasty status: Secondary | ICD-10-CM

## 2013-10-30 DIAGNOSIS — I4891 Unspecified atrial fibrillation: Secondary | ICD-10-CM

## 2013-10-30 NOTE — Progress Notes (Signed)
PATIENT: Derek Sweeney MRN: [id number]  DOB: [birth year]   DOV:11/01/2013 PCP: Kimber Relic, MD  Clinic Note: Chief Complaint  Patient presents with  . Annual Exam    No complaints of chest pain, SOB, edema or dizziness.   HPI: Derek Sweeney is a 72 y.o.  male with a PMH below who presents today for annual followup of atrial fibrillation and coronary disease with PCI to the LAD in 2006. I saw him in December of last year.  Interval History: [name] comes in today feeling relatively fine from a cardiac standpoint. He's in a somewhat somber mood him. He is finalizing his separation with this second wife announced 23 years. He is planning to move out to [facility] to be close to family. She is not she cannot move. He now staying active with moving boxes up and out of the house for current downsize his possessions. This involves going up and down stairs quite a bit to the attic and back. With all this activity he really denies any symptoms of chest tightness or pressure with rest or exertion. He is under lots of stress and has been noticing some palpitations but no significant rapid heart rates are lightheadedness, dizziness or syncope/near syncope. He got quite serious with his diet and exercise for a while and lost weight: 170s, but is now back up to the 190s. This has been easier for him to be less than she was doing eating during this move related dramatic timeframe.  The remainder of Cardiovascular ROS: no chest pain or dyspnea on exertion negative for - edema, loss of consciousness, murmur, orthopnea, paroxysmal nocturnal dyspnea, rapid heart rate or shortness of breath: Additional cardiac review of systems: Lightheadedness - no, dizziness - no, syncope/near-syncope - no; TIA/amaurosis fugax - no Melena - no, hematochezia no; hematuria - no; nosebleeds - no; claudication - no   Prior Cardiac History: 1. Coronary disease diagnosed when he first had atrial fibrillation in 2006: a. Cath  shows 75% LAD just prior to the SP 1. PCI with a 3.0 x 18 mm Cypher DES post dilated to 3.5 mm, had 60% EF and 3+ MR. b. Echo August of 2009: EF 45-50%, dilated left atrium, mild MR, mild aortic sclerosis and mild AI. c. Lexiscan Myoview August 2009: No ischemia, EF 60%, basal anteroseptal and mid anteroseptal attenuation artifact. 2. Paroxysmal atrial fibrillation. He underwent cardioversion in 2008. Not currently on long-term anticoagulation. 3. Known left bundle branch block. 4. Obesity. 5. Obstructive sleep apnea on CPAP, followed by Dr. Tresa Endo.  Past Medical History  Diagnosis Date  . Disturbance of salivary secretion   . Osteoarthrosis, unspecified whether generalized or localized, lower leg   . Depressive disorder, not elsewhere classified   . Impotence of organic origin   . Pain in limb   . Other malaise and fatigue   . Attention or concentration deficit   . Thrombocytopenia, unspecified   . Gross hematuria   . Pain in joint, forearm   . Benign paroxysmal positional vertigo   . Insomnia, unspecified   . Myopathy, unspecified   . Unspecified hearing loss   . Unspecified hypothyroidism   . Hypertension   . PAF (paroxysmal atrial fibrillation)     No recurrence since 2008  . Obstructive sleep apnea (adult) (pediatric)   . Hyperlipidemia   . Cataract 02/2010     bilateral  Derek Sweeney  . CAD S/P percutaneous coronary angioplasty     Allergies  Allergen Reactions  . Penicillins  Current Outpatient Prescriptions  Medication Sig Dispense Refill  . Ascorbic Acid (VITAMIN C) 1000 MG tablet Take one tablet three times per week for vitamin      . aspirin 81 MG tablet Take 81 mg by mouth daily.      Marland Kitchen buPROPion (WELLBUTRIN SR) 150 MG 12 hr tablet Take 3 tablets once daily for depression  270 tablet  1  . calcium-vitamin D 250-100 MG-UNIT per tablet Take 1 tablet by mouth 2 (two) times daily.       . carvedilol (COREG) 3.125 MG tablet Take 1 tablet (3.125 mg total) by mouth  2 (two) times daily with a meal. Take one tablet twice daily for blood pressure  180 tablet  1  . Cholecalciferol (VITAMIN D3) LIQD by Does not apply route. 1 drop daily      . citalopram (CELEXA) 40 MG tablet Take 1 tablet (40 mg total) by mouth daily. Take one tablet once daily for depression  90 tablet  0  . codeine 30 MG tablet Take one tablet every 6 hours as needed for pain  120 tablet  0  . fluticasone (FLONASE) 50 MCG/ACT nasal spray Place 1 spray into the nose 2 (two) times daily. Two sprays each nostril twice daily for allergies  16 g  3  . levothyroxine (SYNTHROID) 100 MCG tablet Take one tablet once daily for thyroid  90 tablet  1  . Multiple Vitamins-Minerals (MULTI VITAMIN/MINERALS PO) Take by mouth daily.      . niacin (NIASPAN) 1000 MG CR tablet Take one tablet twice daily to lower cholesterol  90 tablet  1  . sodium chloride (OCEAN) 0.65 % nasal spray Place 1 spray into the nose as needed for congestion.      Marland Kitchen zolpidem (AMBIEN) 5 MG tablet Take one tablet once at bedtime for rest  30 tablet  3   No current facility-administered medications for this visit.    History   Social History Narrative   He is now separating and divorcing his wife. They have 2 children and 2 grandchildren.    It is quite a stressful time, he is now moving back to [place] as I mentioned. He does not drink, does not smoke, does not really get a lot of exercise now. He has just been kind of out of his routine and packing and a little bit feeling sorry for himself which is understandable, but he otherwise really is trying to stay active and wants to get back into doing things when he gets back to his family    ROS: A comprehensive Review of Systems - Negative except D. significant distress and he is under. He is noted that his pressure setting on CPAP has had to be elevated under significant stress.  PHYSICAL EXAM BP 118/72  Pulse 71  Ht 5\' 6"  (1.676 m)  Wt 87.454 kg (192 lb 12.8 oz)  BMI 31.13  kg/m2 General appearance: alert, cooperative, appears stated age, no distress, mildly obese and Very pleasant. He has his exam in HEENT: NCAT, EOMI, MMM. Anicteric sclerae. His pupils having the signs of cataract surgery.  Neck: Supple, no LAN, JVD or carotid bruit.  Heart: RRR with a normal S1 and a split S2 from the left bune -- occasional ectopy, no M/R/G. Nondisplaced PMI. Abdomen: Mildly protuberant but otherwise soft/NT/ND/NABS. No HSM.  Lungs: CTAB, nonlabored, normal effort, good air movement. Extremities: No C/C/E, 2+ and equal pulses throughout.   ZOX:[id number] today: Yes Rate: 71 , Rhythm:  Sinus rhythm with PVCs and left bundle branch block.;   Recent Labs: Checked by PCP including cholesterol panel and dietary now.  Based on the initial labs, he has had trial off of each of the individual agents he is on.  ASSESSMENT / PLAN: CAD S/P percutaneous coronary angioplasty Doing fairly well with no active anginal symptoms despite his continued high level of activity. He has a single drooling stent in the LAD just prior to SP1. He is taking aspirin along, but no longer on Plavix.  His last Myoview was in 2009 was nonischemic. There is evidence of artifact. Within not having active symptoms, I'm reluctant to do any stress test. Continue medical therapy.  PAF (paroxysmal atrial fibrillation) As best I can tell he has not had a recurrence of atrial fibrillation since 2008 was cardioverted. He was not previously anticoagulated with warfarin, and is not on anticoagulation at this time.  Unspecified essential hypertension Stable and current regimen. Continue with carvedilol  Hyperlipidemia He is not on medication for lipids. Apparently this is being monitored by his physician and he is now not on a statin, as that did not have as good control of his levels as the niacin.  Obstructive sleep apnea (adult) (pediatric) Using CPAP   Orders Placed This Encounter  Procedures  . EKG 12-Lead    No orders of the defined types were placed in this encounter.    Followup: PRN - is moving to [place], he is on a release form to have his records scanned and sent to his new physician.  DAVID W. Herbie Baltimore, M.D., M.S. [facility] [address] [place], [place]  [address]  [phone] Pager # [phone]

## 2013-10-30 NOTE — Patient Instructions (Signed)
You seem to be stable from a Heart perspective -- BP & Heart Rate OK.  No sign of Angina or Heart Failure.  I am sorry to see you going, but wish you the best with your upcoming move to be close to family.  We will be happy to provide medical records to your physician when you get established.  Marykay Lex, MD

## 2013-10-31 ENCOUNTER — Encounter: Payer: Self-pay | Admitting: Cardiology

## 2013-11-01 ENCOUNTER — Encounter: Payer: Self-pay | Admitting: Cardiology

## 2013-11-01 DIAGNOSIS — E669 Obesity, unspecified: Secondary | ICD-10-CM | POA: Insufficient documentation

## 2013-11-01 NOTE — Assessment & Plan Note (Addendum)
Doing fairly well with no active anginal symptoms despite his continued high level of activity. He has a single drooling stent in the LAD just prior to SP1. He is taking aspirin along, but no longer on Plavix.  His last Myoview was in 2009 was nonischemic. There is evidence of artifact. Within not having active symptoms, I'm reluctant to do any stress test. Continue medical therapy.

## 2013-11-01 NOTE — Assessment & Plan Note (Signed)
Stable and current regimen. Continue with carvedilol

## 2013-11-01 NOTE — Assessment & Plan Note (Signed)
As best I can tell he has not had a recurrence of atrial fibrillation since 2008 was cardioverted. He was not previously anticoagulated with warfarin, and is not on anticoagulation at this time.

## 2013-11-01 NOTE — Assessment & Plan Note (Signed)
Using CPAP 

## 2013-11-01 NOTE — Assessment & Plan Note (Signed)
He is not on medication for lipids. Apparently this is being monitored by his physician and he is now not on a statin, as that did not have as good control of his levels as the niacin.

## 2013-11-07 ENCOUNTER — Encounter: Payer: Self-pay | Admitting: Nurse Practitioner

## 2013-11-07 VITALS — BP 126/70 | HR 70 | Temp 97.8°F | Resp 14 | Wt 197.8 lb

## 2013-11-07 DIAGNOSIS — I10 Essential (primary) hypertension: Secondary | ICD-10-CM

## 2013-11-07 DIAGNOSIS — Z23 Encounter for immunization: Secondary | ICD-10-CM

## 2013-11-07 DIAGNOSIS — F329 Major depressive disorder, single episode, unspecified: Secondary | ICD-10-CM

## 2013-11-07 DIAGNOSIS — E785 Hyperlipidemia, unspecified: Secondary | ICD-10-CM

## 2013-11-07 DIAGNOSIS — E039 Hypothyroidism, unspecified: Secondary | ICD-10-CM

## 2013-11-07 MED ORDER — FLUTICASONE PROPIONATE 50 MCG/ACT NA SUSP: 1.0000 | Freq: Two times a day (BID) | NASAL | Status: DC

## 2013-11-07 MED ORDER — CARVEDILOL 3.125 MG PO TABS: 3.1250 mg | ORAL_TABLET | Freq: Two times a day (BID) | ORAL | Status: DC

## 2013-11-07 MED ORDER — CITALOPRAM HYDROBROMIDE 40 MG PO TABS: 40.0000 mg | ORAL_TABLET | Freq: Every day | ORAL | Status: DC

## 2013-11-07 MED ORDER — BUPROPION HCL ER (SR) 150 MG PO TB12: ORAL_TABLET | ORAL | Status: DC

## 2013-11-07 MED ORDER — LEVOTHYROXINE SODIUM 100 MCG PO TABS: ORAL_TABLET | ORAL | Status: DC

## 2013-11-07 MED ORDER — NIACIN ER (ANTIHYPERLIPIDEMIC) 1000 MG PO TBCR: EXTENDED_RELEASE_TABLET | ORAL | Status: DC

## 2013-11-15 ENCOUNTER — Other Ambulatory Visit: Payer: Self-pay | Admitting: *Deleted

## 2013-11-15 DIAGNOSIS — M1990 Unspecified osteoarthritis, unspecified site: Secondary | ICD-10-CM

## 2013-11-15 DIAGNOSIS — G4700 Insomnia, unspecified: Secondary | ICD-10-CM

## 2013-11-15 MED ORDER — CODEINE SULFATE 30 MG PO TABS: ORAL_TABLET | ORAL | Status: DC

## 2013-11-15 MED ORDER — ZOLPIDEM TARTRATE 5 MG PO TABS: ORAL_TABLET | ORAL | Status: DC

## 2014-01-30 ENCOUNTER — Ambulatory Visit (INDEPENDENT_AMBULATORY_CARE_PROVIDER_SITE_OTHER): Payer: Medicare Other | Admitting: Nurse Practitioner

## 2014-01-30 ENCOUNTER — Encounter: Payer: Self-pay | Admitting: Nurse Practitioner

## 2014-01-30 VITALS — BP 136/84 | HR 86 | Resp 10 | Wt 193.6 lb

## 2014-01-30 DIAGNOSIS — F3289 Other specified depressive episodes: Secondary | ICD-10-CM

## 2014-01-30 DIAGNOSIS — F329 Major depressive disorder, single episode, unspecified: Secondary | ICD-10-CM

## 2014-01-30 DIAGNOSIS — M129 Arthropathy, unspecified: Secondary | ICD-10-CM

## 2014-01-30 DIAGNOSIS — I10 Essential (primary) hypertension: Secondary | ICD-10-CM

## 2014-01-30 DIAGNOSIS — M1990 Unspecified osteoarthritis, unspecified site: Secondary | ICD-10-CM

## 2014-01-30 DIAGNOSIS — E785 Hyperlipidemia, unspecified: Secondary | ICD-10-CM

## 2014-01-30 DIAGNOSIS — G4700 Insomnia, unspecified: Secondary | ICD-10-CM

## 2014-01-30 DIAGNOSIS — E039 Hypothyroidism, unspecified: Secondary | ICD-10-CM

## 2014-01-30 DIAGNOSIS — F32A Depression, unspecified: Secondary | ICD-10-CM

## 2014-01-30 MED ORDER — LEVOTHYROXINE SODIUM 100 MCG PO TABS: ORAL_TABLET | ORAL | Status: DC

## 2014-01-30 MED ORDER — NIACIN ER (ANTIHYPERLIPIDEMIC) 1000 MG PO TBCR: EXTENDED_RELEASE_TABLET | ORAL | Status: DC

## 2014-01-30 MED ORDER — BUPROPION HCL ER (SR) 150 MG PO TB12: ORAL_TABLET | ORAL | Status: DC

## 2014-01-30 MED ORDER — TETANUS-DIPHTH-ACELL PERTUSSIS 5-2.5-18.5 LF-MCG/0.5 IM SUSP: 0.5000 mL | Freq: Once | INTRAMUSCULAR | Status: AC

## 2014-01-30 MED ORDER — CITALOPRAM HYDROBROMIDE 40 MG PO TABS: 40.0000 mg | ORAL_TABLET | Freq: Every day | ORAL | Status: DC

## 2014-01-30 MED ORDER — CODEINE SULFATE 30 MG PO TABS: ORAL_TABLET | ORAL | Status: DC

## 2014-01-30 MED ORDER — CARVEDILOL 3.125 MG PO TABS: 3.1250 mg | ORAL_TABLET | Freq: Two times a day (BID) | ORAL | Status: DC

## 2014-01-30 MED ORDER — ZOLPIDEM TARTRATE 5 MG PO TABS: ORAL_TABLET | ORAL | Status: DC

## 2014-01-30 NOTE — Progress Notes (Signed)
Patient ID: Derek Sweeney, male   DOB: 09/12/1941, 73 y.o.   MRN: [id number]    Allergies  Allergen Reactions  . Penicillins     Chief Complaint  Patient presents with  . Follow-up    Patient requested follow-up visit prior to moving     HPI: Patient is a 73 y.o. male seen in the office today for follow up prior to moving to [place] coming tomorrow for final estimate Has not set up medical care in [place] yet.  Ongoing sinus issues- uses saline 1-2 times daily but sometimes will have a flare of increase congestion. - uses Flonase as well  Now has braces for knee and ankle due to knee pain needs surgery but orthopedic and pt agree to wait until the move HYPOTHYROIDISM The hypothyroidism is stable. No complications noted from the medication presently being used. TSH at goal 07/2013 HYPERLIPIDEMIA The patient's most recent LDL is at goal.No complications noted from the medication presently being used. taking fish oil and niacin daily last cholesterol was done 07/2013 and LDL at goal  DEPRESSIVE DISORDER NEC The depression remains stable. Remains on celexa and Wellbutrin  OBSTRUCTIVE SLEEP APNEA Unchanged. Uses CPAP.  HTN UNSPECIFIED The patient denies chest pain, shortness of breath, dyspnea on exertion, pedal edema, or headache.  OSTEOARTHROSIS, KNEE using codeine as needed for osteoarthritis.  INSOMNIA, UNSPECIFIED stable- taking ambien 5mg  qhs and helping him sleep  Would like Rx for Tdap  Review of Systems:  Review of Systems  Constitutional: Negative for fever, chills and malaise/fatigue.  HENT: Positive for hearing loss.   Respiratory: Negative for cough and shortness of breath.   Cardiovascular: Negative for chest pain and palpitations.  Gastrointestinal: Negative for heartburn, abdominal pain, diarrhea and constipation.  Genitourinary: Negative for dysuria, urgency and frequency.  Musculoskeletal: Positive for joint pain (ankle and knee pain).  Skin: Negative  for itching and rash.  Neurological: Negative for dizziness, weakness and headaches.  Psychiatric/Behavioral: The patient is nervous/anxious and has insomnia.        Long standing history of depression- this has been stable     Past Medical History  Diagnosis Date  . Disturbance of salivary secretion   . Osteoarthrosis, unspecified whether generalized or localized, lower leg   . Depressive disorder, not elsewhere classified   . Impotence of organic origin   . Pain in limb   . Other malaise and fatigue   . Attention or concentration deficit   . Thrombocytopenia, unspecified   . Gross hematuria   . Pain in joint, forearm   . Benign paroxysmal positional vertigo   . Insomnia, unspecified   . Myopathy, unspecified   . Unspecified hearing loss   . Unspecified hypothyroidism   . Hypertension   . PAF (paroxysmal atrial fibrillation)     No recurrence since 2008  . Obstructive sleep apnea (adult) (pediatric)   . Hyperlipidemia   . Cataract 02/2010     bilateral  [name]  . CAD S/P percutaneous coronary angioplasty    Past Surgical History  Procedure Laterality Date  . Tonsillectomy  1947  . Bone spur  1968  . Stomach surgery  1974  . Cholecystectomy  1987  . Appendectomy    . Septoplasty  1989  . Ankle surgery  2005  . Eye surgery  2011    bilateral cataract  . Wrist surgery Right 04/24/2010    Gramig MD  . Nasal sinus surgery  1995    polys removed  .  Exploratory laparotomy     Social History:   reports that he has never smoked. He does not have any smokeless tobacco history on file. He reports that he does not drink alcohol or use illicit drugs.  Family History  Problem Relation Age of Onset  . Cancer Father     colon    Medications: Patient's Medications  New Prescriptions   No medications on file  Previous Medications   ASCORBIC ACID (VITAMIN C) 1000 MG TABLET    Take one tablet three times per week for vitamin   ASPIRIN 81 MG TABLET    Take 81 mg by mouth  daily.   BUPROPION (WELLBUTRIN SR) 150 MG 12 HR TABLET    Take 3 tablets once daily for depression   CALCIUM-VITAMIN D 250-100 MG-UNIT PER TABLET    Take 1 tablet by mouth 2 (two) times daily.    CHOLECALCIFEROL (VITAMIN D3) LIQD    by Does not apply route. 1 drop daily   CITALOPRAM (CELEXA) 40 MG TABLET    Take 1 tablet (40 mg total) by mouth daily. Take one tablet once daily for depression   CODEINE 30 MG TABLET    Take one tablet every 6 hours as needed for pain   FLUTICASONE (FLONASE) 50 MCG/ACT NASAL SPRAY    Place 1 spray into both nostrils 2 (two) times daily. Two sprays each nostril twice daily for allergies   LEVOTHYROXINE (SYNTHROID) 100 MCG TABLET    Take one tablet once daily for thyroid   MULTIPLE VITAMINS-MINERALS (MULTI VITAMIN/MINERALS PO)    Take by mouth daily.   NIACIN (NIASPAN) 1000 MG CR TABLET    Take one tablet twice daily to lower cholesterol   SODIUM CHLORIDE (OCEAN) 0.65 % NASAL SPRAY    Place 1 spray into the nose as needed for congestion.   ZOLPIDEM (AMBIEN) 5 MG TABLET    Take one tablet once at bedtime for rest  Modified Medications   Modified Medication Previous Medication   CARVEDILOL (COREG) 3.125 MG TABLET carvedilol (COREG) 3.125 MG tablet      Take 3.125 mg by mouth 2 (two) times daily with a meal. for blood pressure    Take 1 tablet (3.125 mg total) by mouth 2 (two) times daily with a meal. Take one tablet twice daily for blood pressure  Discontinued Medications   No medications on file     Physical Exam:  Filed Vitals:   01/30/14 1406  BP: 136/84  Pulse: 86  Resp: 10  Weight: 193 lb 9.6 oz (87.816 kg)  SpO2: 95%    Physical Exam  Constitutional: He is oriented to person, place, and time and well-developed, well-nourished, and in no distress. No distress.  HENT:  Head: Normocephalic and atraumatic.  Nose: Nose normal.  Mouth/Throat: Oropharynx is clear and moist. No oropharyngeal exudate.  Eyes: Conjunctivae and EOM are normal. Pupils are  equal, round, and reactive to light.  Neck: Normal range of motion. Neck supple.  Cardiovascular: Normal rate, regular rhythm and normal heart sounds.   Pulmonary/Chest: Effort normal and breath sounds normal. No respiratory distress.  Abdominal: Soft. Bowel sounds are normal. He exhibits no distension.  Musculoskeletal: Normal range of motion. He exhibits no edema and no tenderness.  Neurological: He is alert and oriented to person, place, and time. Gait normal.  Skin: Skin is warm and dry. He is not diaphoretic. No erythema.  Psychiatric: Affect normal.     Labs reviewed: Basic Metabolic Panel:  Recent Labs  08/07/13 1154 10/17/13 1149  NA 139 141  K 4.0 4.2  CL 98 100  CO2 27 28  GLUCOSE 103* 86  BUN 25 15  CREATININE 1.02 0.92  CALCIUM 9.0 9.3  TSH 0.889  --    Liver Function Tests:  Recent Labs  08/07/13 1154 10/17/13 1149  AST 55* 28  ALT 101* 44  ALKPHOS 92 86  BILITOT 0.5 0.6  PROT 5.7* 6.1   No results found for this basename: LIPASE, AMYLASE,  in the last 8760 hours No results found for this basename: AMMONIA,  in the last 8760 hours CBC:  Recent Labs  08/07/13 1154  WBC 5.4  NEUTROABS 2.3  HGB 13.3  HCT 39.2  MCV 100*  PLT 104*   Lipid Panel:  Recent Labs  08/07/13 1154  HDL 57  LDLCALC 68  TRIG 70  CHOLHDL 2.4   TSH:  Recent Labs  08/07/13 1154  TSH 0.889   A1C: No components found with this basename: A1C,    Assessment/Plan 1. Arthritis -will cont codeine 30 MG tablet; Take one tablet every 6 hours as needed for pain  Dispense: 120 tablet; Refill: 0  2. Insomnia -stable on current medication - zolpidem (AMBIEN) 5 MG tablet; Take one tablet once at bedtime for rest  Dispense: 30 tablet; Refill: 0  3. Other and unspecified hyperlipidemia - niacin (NIASPAN) 1000 MG CR tablet; Take one tablet twice daily to lower cholesterol  Dispense: 180 tablet; Refill: 1  4. Depression -stable on current medications - buPROPion  (WELLBUTRIN SR) 150 MG 12 hr tablet; Take 3 tablets once daily for depression  Dispense: 270 tablet; Refill: 1 - citalopram (CELEXA) 40 MG tablet; Take 1 tablet (40 mg total) by mouth daily. Take one tablet once daily for depression  Dispense: 90 tablet; Refill: 0  5. Unspecified hypothyroidism -will cont current medication - levothyroxine (SYNTHROID) 100 MCG tablet; Take one tablet once daily for thyroid  Dispense: 90 tablet; Refill: 1   Will follow up blood work today- pt instructed to establish with PCP as soon as he moved.

## 2014-01-31 LAB — COMPREHENSIVE METABOLIC PANEL
ALBUMIN: 4.1 g/dL (ref 3.5–4.8)
ALT: 38 IU/L (ref 0–44)
AST: 26 IU/L (ref 0–40)
Albumin/Globulin Ratio: 1.9 (ref 1.1–2.5)
Alkaline Phosphatase: 93 IU/L (ref 39–117)
BUN/Creatinine Ratio: 22 (ref 10–22)
BUN: 22 mg/dL (ref 8–27)
CALCIUM: 9.6 mg/dL (ref 8.6–10.2)
CO2: 22 mmol/L (ref 18–29)
CREATININE: 1 mg/dL (ref 0.76–1.27)
Chloride: 101 mmol/L (ref 97–108)
GFR calc Af Amer: 87 mL/min/{1.73_m2} (ref >59)
GFR calc non Af Amer: 75 mL/min/{1.73_m2} (ref >59)
GLOBULIN, TOTAL: 2.2 g/dL (ref 1.5–4.5)
GLUCOSE: 92 mg/dL (ref 65–99)
Potassium: 4.2 mmol/L (ref 3.5–5.2)
Sodium: 144 mmol/L (ref 134–144)
TOTAL PROTEIN: 6.3 g/dL (ref 6.0–8.5)
Total Bilirubin: 0.3 mg/dL (ref 0.0–1.2)

## 2014-01-31 LAB — CBC WITH DIFFERENTIAL
BASOS: 0 %
Basophils Absolute: 0 10*3/uL (ref 0.0–0.2)
EOS: 2 %
Eosinophils Absolute: 0.1 10*3/uL (ref 0.0–0.4)
HCT: 40.1 % (ref 37.5–51.0)
HEMOGLOBIN: 13.3 g/dL (ref 12.6–17.7)
IMMATURE GRANS (ABS): 0 10*3/uL (ref 0.0–0.1)
Immature Granulocytes: 0 %
Lymphocytes Absolute: 2.1 10*3/uL (ref 0.7–3.1)
Lymphs: 34 %
MCH: 32.8 pg (ref 26.6–33.0)
MCHC: 33.2 g/dL (ref 31.5–35.7)
MCV: 99 fL — ABNORMAL HIGH (ref 79–97)
Monocytes Absolute: 0.5 10*3/uL (ref 0.1–0.9)
Monocytes: 7 %
NEUTROS PCT: 57 %
Neutrophils Absolute: 3.6 10*3/uL (ref 1.4–7.0)
Platelets: 142 10*3/uL — ABNORMAL LOW (ref 150–379)
RBC: 4.06 x10E6/uL — AB (ref 4.14–5.80)
RDW: 13.7 % (ref 12.3–15.4)
WBC: 6.3 10*3/uL (ref 3.4–10.8)

## 2014-04-23 ENCOUNTER — Other Ambulatory Visit: Payer: Self-pay | Admitting: *Deleted

## 2014-04-23 DIAGNOSIS — F329 Major depressive disorder, single episode, unspecified: Secondary | ICD-10-CM

## 2014-04-23 DIAGNOSIS — F32A Depression, unspecified: Secondary | ICD-10-CM

## 2014-04-23 MED ORDER — CITALOPRAM HYDROBROMIDE 40 MG PO TABS: ORAL_TABLET | ORAL | Status: AC

## 2014-04-23 NOTE — Telephone Encounter (Signed)
[facility]

## 2014-05-23 ENCOUNTER — Ambulatory Visit (INDEPENDENT_AMBULATORY_CARE_PROVIDER_SITE_OTHER): Payer: Medicare Other | Admitting: Nurse Practitioner

## 2014-05-23 VITALS — BP 138/80 | HR 70 | Temp 97.9°F | Resp 18 | Wt 187.2 lb

## 2014-05-23 DIAGNOSIS — F32A Depression, unspecified: Secondary | ICD-10-CM

## 2014-05-23 DIAGNOSIS — G4700 Insomnia, unspecified: Secondary | ICD-10-CM

## 2014-05-23 DIAGNOSIS — I10 Essential (primary) hypertension: Secondary | ICD-10-CM

## 2014-05-23 DIAGNOSIS — M1990 Unspecified osteoarthritis, unspecified site: Secondary | ICD-10-CM

## 2014-05-23 DIAGNOSIS — E039 Hypothyroidism, unspecified: Secondary | ICD-10-CM

## 2014-05-23 DIAGNOSIS — E785 Hyperlipidemia, unspecified: Secondary | ICD-10-CM

## 2014-05-23 DIAGNOSIS — F329 Major depressive disorder, single episode, unspecified: Secondary | ICD-10-CM

## 2014-05-23 DIAGNOSIS — G4733 Obstructive sleep apnea (adult) (pediatric): Secondary | ICD-10-CM

## 2014-05-23 DIAGNOSIS — M129 Arthropathy, unspecified: Secondary | ICD-10-CM

## 2014-05-23 DIAGNOSIS — F3289 Other specified depressive episodes: Secondary | ICD-10-CM

## 2014-05-23 MED ORDER — CODEINE SULFATE 30 MG PO TABS: ORAL_TABLET | ORAL | Status: AC

## 2014-05-23 MED ORDER — BUPROPION HCL ER (SR) 150 MG PO TB12: ORAL_TABLET | ORAL | Status: AC

## 2014-05-23 MED ORDER — ZOLPIDEM TARTRATE 10 MG PO TABS: ORAL_TABLET | ORAL | Status: AC

## 2014-05-23 MED ORDER — LEVOTHYROXINE SODIUM 100 MCG PO TABS: ORAL_TABLET | ORAL | Status: AC

## 2014-05-23 MED ORDER — FLUTICASONE PROPIONATE 50 MCG/ACT NA SUSP: 1.0000 | Freq: Two times a day (BID) | NASAL | Status: DC

## 2014-05-23 MED ORDER — CARVEDILOL 3.125 MG PO TABS: 3.1250 mg | ORAL_TABLET | Freq: Two times a day (BID) | ORAL | Status: AC

## 2014-05-23 MED ORDER — SALINE NASAL SPRAY 0.65 % NA SOLN: 1.0000 | NASAL | Status: AC | PRN

## 2014-05-23 MED ORDER — NIACIN ER (ANTIHYPERLIPIDEMIC) 1000 MG PO TBCR: EXTENDED_RELEASE_TABLET | ORAL | Status: AC

## 2014-05-27 NOTE — Progress Notes (Signed)
Patient ID: Derek Sweeney, male   DOB: [birth year], 73 y.o.   MRN: [id number]    Allergies  Allergen Reactions  . Penicillins     Chief Complaint  Patient presents with  . Medical Management of Chronic Issues    HPI: Patient is a 73 y.o. male seen in the office today before move. Would like to get refills on all medications, moving truck coming today and tomorrow. His flight leaves out early next week. Without any issues with current medication. Doing well at this time.  Discussed getting PCP once he gets moved.   Review of Systems:  Review of Systems  Constitutional: Negative for fever, chills and malaise/fatigue.  HENT: Positive for hearing loss.   Respiratory: Negative for cough and shortness of breath.   Cardiovascular: Negative for chest pain and palpitations.  Gastrointestinal: Negative for heartburn, abdominal pain, diarrhea and constipation.  Genitourinary: Negative for dysuria, urgency and frequency.  Musculoskeletal: Positive for joint pain (ankle and knee pain).  Skin: Negative for itching and rash.  Neurological: Negative for dizziness, weakness and headaches.  Psychiatric/Behavioral: Negative for suicidal ideas. Depression: well controlled  The patient is nervous/anxious and has insomnia.        Long standing history of depression- this has been stable     Past Medical History  Diagnosis Date  . Disturbance of salivary secretion   . Osteoarthrosis, unspecified whether generalized or localized, lower leg   . Depressive disorder, not elsewhere classified   . Impotence of organic origin   . Pain in limb   . Other malaise and fatigue   . Attention or concentration deficit   . Thrombocytopenia, unspecified   . Gross hematuria   . Pain in joint, forearm   . Benign paroxysmal positional vertigo   . Insomnia, unspecified   . Myopathy, unspecified   . Unspecified hearing loss   . Unspecified hypothyroidism   . Hypertension   . PAF (paroxysmal atrial  fibrillation)     No recurrence since 2008  . Obstructive sleep apnea (adult) (pediatric)   . Hyperlipidemia   . Cataract 02/2010     bilateral  [facility] RiggsShapiro  . CAD S/P percutaneous coronary angioplasty    Past Surgical History  Procedure Laterality Date  . Tonsillectomy  1947  . Bone spur  1968  . Stomach surgery  1974  . Cholecystectomy  1987  . Appendectomy    . Septoplasty  1989  . Ankle surgery  2005  . Eye surgery  2011    bilateral cataract  . Wrist surgery Right 04/24/2010    Gramig MD  . Nasal sinus surgery  1995    polys removed  . Exploratory laparotomy     Social History:   reports that he has never smoked. He does not have any smokeless tobacco history on file. He reports that he does not drink alcohol or use illicit drugs.  Family History  Problem Relation Age of Onset  . Cancer Father     colon    Medications: Patient's Medications  New Prescriptions   No medications on file  Previous Medications   ASCORBIC ACID (VITAMIN C) 1000 MG TABLET    Take one tablet three times per week for vitamin   ASPIRIN 81 MG TABLET    Take 81 mg by mouth daily.   CALCIUM-VITAMIN D 250-100 MG-UNIT PER TABLET    Take 1 tablet by mouth 2 (two) times daily.    CHOLECALCIFEROL (VITAMIN D3) LIQD    by  Does not apply route. 1 drop daily   CITALOPRAM (CELEXA) 40 MG TABLET    Take one tablet once daily for depression   MULTIPLE VITAMINS-MINERALS (MULTI VITAMIN/MINERALS PO)    Take by mouth daily.   TDAP (BOOSTRIX) 5-2.5-18.5 LF-MCG/0.5 INJECTION    Inject 0.5 mLs into the muscle once.  Modified Medications   Modified Medication Previous Medication   BUPROPION (WELLBUTRIN SR) 150 MG 12 HR TABLET buPROPion (WELLBUTRIN SR) 150 MG 12 hr tablet      Take 3 tablets once daily for depression    Take 3 tablets once daily for depression   CARVEDILOL (COREG) 3.125 MG TABLET carvedilol (COREG) 3.125 MG tablet      Take 1 tablet (3.125 mg total) by mouth 2 (two) times daily with a meal. for blood  pressure    Take 1 tablet (3.125 mg total) by mouth 2 (two) times daily with a meal. for blood pressure   CODEINE 30 MG TABLET codeine 30 MG tablet      Take one tablet every 6 hours as needed for pain    Take one tablet every 6 hours as needed for pain   FLUTICASONE (FLONASE) 50 MCG/ACT NASAL SPRAY fluticasone (FLONASE) 50 MCG/ACT nasal spray      Place 1 spray into both nostrils 2 (two) times daily. Two sprays each nostril twice daily for allergies    Place 1 spray into both nostrils 2 (two) times daily. Two sprays each nostril twice daily for allergies   LEVOTHYROXINE (SYNTHROID) 100 MCG TABLET levothyroxine (SYNTHROID) 100 MCG tablet      Take one tablet once daily for thyroid    Take one tablet once daily for thyroid   NIACIN (NIASPAN) 1000 MG CR TABLET niacin (NIASPAN) 1000 MG CR tablet      Take one tablet twice daily to lower cholesterol    Take one tablet twice daily to lower cholesterol   SODIUM CHLORIDE (OCEAN) 0.65 % NASAL SPRAY sodium chloride (OCEAN) 0.65 % nasal spray      Place 1 spray into the nose as needed for congestion.    Place 1 spray into the nose as needed for congestion.   ZOLPIDEM (AMBIEN) 10 MG TABLET zolpidem (AMBIEN) 5 MG tablet      Take 1/2- 1 tablet once at bedtime for rest    Take one tablet once at bedtime for rest  Discontinued Medications   No medications on file     Physical Exam:  Filed Vitals:   05/23/14 1428  BP: 138/80  Pulse: 70  Temp: 97.9 F (36.6 C)  TempSrc: Oral  Resp: 18  Weight: 187 lb 3.2 oz (84.913 kg)  SpO2: 96%    Physical Exam  Constitutional: He is oriented to person, place, and time and well-developed, well-nourished, and in no distress. No distress.  HENT:  Head: Normocephalic and atraumatic.  Nose: Nose normal.  Mouth/Throat: Oropharynx is clear and moist. No oropharyngeal exudate.  Eyes: Conjunctivae and EOM are normal. Pupils are equal, round, and reactive to light.  Neck: Normal range of motion. Neck supple.    Cardiovascular: Normal rate, regular rhythm and normal heart sounds.   Pulmonary/Chest: Effort normal and breath sounds normal. No respiratory distress.  Abdominal: Soft. Bowel sounds are normal. He exhibits no distension.  Musculoskeletal: Normal range of motion. He exhibits no edema and no tenderness.  Neurological: He is alert and oriented to person, place, and time. Gait normal.  Skin: Skin is warm and dry. He is  not diaphoretic. No erythema.  Psychiatric: Affect normal.     Labs reviewed: Basic Metabolic Panel:  Recent Labs  16/10/96 1154 10/17/13 1149 01/30/14 1500  NA 139 141 144  K 4.0 4.2 4.2  CL 98 100 101  CO2 27 28 22   GLUCOSE 103* 86 92  BUN 25 15 22   CREATININE 1.02 0.92 1.00  CALCIUM 9.0 9.3 9.6  TSH 0.889  --   --    Liver Function Tests:  Recent Labs  08/07/13 1154 10/17/13 1149 01/30/14 1500  AST 55* 28 26  ALT 101* 44 38  ALKPHOS 92 86 93  BILITOT 0.5 0.6 0.3  PROT 5.7* 6.1 6.3   No results found for this basename: LIPASE, AMYLASE,  in the last 8760 hours No results found for this basename: AMMONIA,  in the last 8760 hours CBC:  Recent Labs  08/07/13 1154 01/30/14 1500  WBC 5.4 6.3  NEUTROABS 2.3 3.6  HGB 13.3 13.3  HCT 39.2 40.1  MCV 100* 99*  PLT 104* 142*   Lipid Panel:  Recent Labs  08/07/13 1154  HDL 57  LDLCALC 68  TRIG 70  CHOLHDL 2.4   TSH:  Recent Labs  08/07/13 1154  TSH 0.889   A1C: No results found for this basename: HGBA1C     Assessment/Plan 1. Unspecified essential hypertension -well controlled with current medication, refill provided for move - carvedilol (COREG) 3.125 MG tablet; Take 1 tablet (3.125 mg total) by mouth 2 (two) times daily with a meal. for blood pressure  Dispense: 180 tablet; Refill: 5  2. Obstructive sleep apnea (adult) (pediatric) conts CPAP  3. Depression -stable on celexa and  - buPROPion (WELLBUTRIN SR) 150 MG 12 hr tablet; Take 3 tablets once daily for depression   Dispense: 270 tablet; Refill: 3  4. Insomnia -with some concerns due to move, will increase to 10 mg but only to use IF NEEDED otherwise to use 1/2 tablet qhs for sleep - zolpidem (AMBIEN) 10 MG tablet; Take 1/2- 1 tablet once at bedtime for rest  Dispense: 30 tablet; Refill: 0  5. Other and unspecified hyperlipidemia -refill provided for move, doing well on current medication - niacin (NIASPAN) 1000 MG CR tablet; Take one tablet twice daily to lower cholesterol  Dispense: 180 tablet; Refill: 5  6. Unspecified hypothyroidism - will provide follow up - levothyroxine (SYNTHROID) 100 MCG tablet; Take one tablet once daily for thyroid  Dispense: 90 tablet; Refill: 5  7. Arthritis -stable on current dose, refill provided  - codeine 30 MG tablet; Take one tablet every 6 hours as needed for pain  Dispense: 120 tablet; Refill: 0

## 2014-05-30 ENCOUNTER — Telehealth: Payer: Self-pay

## 2014-05-30 NOTE — Telephone Encounter (Signed)
Patient states [facility] called him today stating they are unable to process his rx for flonase. Patient could not understand why they can not process rx.  Patient informed I will follow-up with [facility]. I called [facility] and they were unable to locate patient. I will f/u with patient and have him call [facility] and tell them to call us.

## 2014-06-05 NOTE — Telephone Encounter (Signed)
Left message on voicemail informing patient if rx for flonase is still being held at [facility] and he needs our assistance to return call. IF rx was processed, no need to return call.

## 2014-07-30 ENCOUNTER — Telehealth: Payer: Self-pay | Admitting: *Deleted

## 2014-07-30 NOTE — Telephone Encounter (Signed)
Faxed CPAP supply order back to [facility] [facility] @ [phone].

## 2015-09-05 ENCOUNTER — Telehealth: Payer: Self-pay | Admitting: *Deleted

## 2015-09-05 NOTE — Telephone Encounter (Signed)
Faxed CPAP supply order to [facility].
